# Patient Record
Sex: Female | Born: 1980 | Race: White | Hispanic: No | Marital: Married | Smoking: Never smoker
Health system: Southern US, Community
[De-identification: ages and names within clinical notes are randomized; demographics above are authoritative.]

## PROBLEM LIST (undated history)

## (undated) DIAGNOSIS — F419 Anxiety disorder, unspecified: Secondary | ICD-10-CM

## (undated) DIAGNOSIS — E063 Autoimmune thyroiditis: Secondary | ICD-10-CM

## (undated) DIAGNOSIS — F32A Depression, unspecified: Secondary | ICD-10-CM

## (undated) DIAGNOSIS — F329 Major depressive disorder, single episode, unspecified: Secondary | ICD-10-CM

## (undated) HISTORY — DX: Anxiety disorder, unspecified: F41.9

## (undated) HISTORY — PX: BREAST SURGERY: SHX581

## (undated) HISTORY — DX: Depression, unspecified: F32.A

## (undated) HISTORY — DX: Major depressive disorder, single episode, unspecified: F32.9

## (undated) HISTORY — PX: DILATION AND CURETTAGE OF UTERUS: SHX78

---

## 2008-05-19 DIAGNOSIS — F411 Generalized anxiety disorder: Secondary | ICD-10-CM | POA: Insufficient documentation

## 2008-11-07 DIAGNOSIS — L708 Other acne: Secondary | ICD-10-CM | POA: Insufficient documentation

## 2011-04-11 ENCOUNTER — Encounter: Payer: Self-pay | Admitting: Maternal & Fetal Medicine

## 2011-05-23 ENCOUNTER — Encounter: Payer: Self-pay | Admitting: Maternal & Fetal Medicine

## 2011-08-01 ENCOUNTER — Encounter: Payer: Self-pay | Admitting: Maternal & Fetal Medicine

## 2011-09-01 ENCOUNTER — Encounter: Payer: Self-pay | Admitting: Obstetrics and Gynecology

## 2011-09-29 ENCOUNTER — Encounter: Payer: Self-pay | Admitting: Obstetrics and Gynecology

## 2011-10-25 ENCOUNTER — Inpatient Hospital Stay: Payer: Self-pay | Admitting: Obstetrics and Gynecology

## 2011-10-25 LAB — CBC WITH DIFFERENTIAL/PLATELET
Basophil %: 0.2 %
Eosinophil #: 0 10*3/uL (ref 0.0–0.7)
Eosinophil %: 0.4 %
HCT: 38.7 % (ref 35.0–47.0)
HGB: 13.1 g/dL (ref 12.0–16.0)
Lymphocyte %: 16.1 %
MCH: 32.1 pg (ref 26.0–34.0)
MCHC: 33.8 g/dL (ref 32.0–36.0)
Neutrophil %: 75.6 %
Platelet: 138 10*3/uL — ABNORMAL LOW (ref 150–440)
RBC: 4.08 10*6/uL (ref 3.80–5.20)

## 2011-10-26 LAB — PLATELET COUNT: Platelet: 128 10*3/uL — ABNORMAL LOW (ref 150–440)

## 2012-04-04 HISTORY — PX: BREAST EXCISIONAL BIOPSY: SUR124

## 2012-04-04 HISTORY — PX: OTHER SURGICAL HISTORY: SHX169

## 2012-11-14 ENCOUNTER — Ambulatory Visit: Payer: Self-pay | Admitting: Internal Medicine

## 2013-02-20 ENCOUNTER — Ambulatory Visit: Payer: Self-pay | Admitting: Obstetrics and Gynecology

## 2013-03-26 ENCOUNTER — Ambulatory Visit: Payer: Self-pay | Admitting: Obstetrics and Gynecology

## 2013-03-26 ENCOUNTER — Other Ambulatory Visit: Payer: Self-pay | Admitting: Obstetrics and Gynecology

## 2013-03-26 DIAGNOSIS — R922 Inconclusive mammogram: Secondary | ICD-10-CM

## 2013-04-10 ENCOUNTER — Ambulatory Visit
Admission: RE | Admit: 2013-04-10 | Discharge: 2013-04-10 | Disposition: A | Discharge status: Home / Self Care | Payer: BC Managed Care – PPO | Source: Ambulatory Visit | Attending: Obstetrics and Gynecology | Admitting: Obstetrics and Gynecology

## 2013-04-10 DIAGNOSIS — R922 Inconclusive mammogram: Secondary | ICD-10-CM

## 2013-04-10 MED ORDER — GADOBENATE DIMEGLUMINE 529 MG/ML IV SOLN
11.0000 mL | Freq: Once | INTRAVENOUS | Status: AC | PRN
  Administered 2013-04-10: 11 mL via INTRAVENOUS

## 2013-04-11 ENCOUNTER — Other Ambulatory Visit: Payer: Self-pay

## 2013-04-11 ENCOUNTER — Encounter: Payer: Self-pay | Admitting: Specialist

## 2013-05-05 ENCOUNTER — Encounter: Payer: Self-pay | Admitting: Specialist

## 2013-06-02 ENCOUNTER — Encounter: Payer: Self-pay | Admitting: Specialist

## 2013-07-03 ENCOUNTER — Encounter: Payer: Self-pay | Admitting: Specialist

## 2013-11-13 DIAGNOSIS — Z8639 Personal history of other endocrine, nutritional and metabolic disease: Secondary | ICD-10-CM | POA: Insufficient documentation

## 2014-02-08 ENCOUNTER — Emergency Department: Payer: Self-pay | Admitting: Emergency Medicine

## 2014-02-08 LAB — URINALYSIS, COMPLETE
BILIRUBIN, UR: NEGATIVE
Bacteria: NONE SEEN
Glucose,UR: NEGATIVE mg/dL (ref 0–75)
KETONE: NEGATIVE
Leukocyte Esterase: NEGATIVE
Nitrite: NEGATIVE
PROTEIN: NEGATIVE
Ph: 6 (ref 4.5–8.0)
Specific Gravity: 1.006 (ref 1.003–1.030)
WBC UR: 1 /HPF (ref 0–5)

## 2014-02-08 LAB — CBC
HCT: 40.2 % (ref 35.0–47.0)
HGB: 13.6 g/dL (ref 12.0–16.0)
MCH: 31.8 pg (ref 26.0–34.0)
MCHC: 33.7 g/dL (ref 32.0–36.0)
MCV: 94 fL (ref 80–100)
PLATELETS: 135 10*3/uL — AB (ref 150–440)
RBC: 4.26 10*6/uL (ref 3.80–5.20)
RDW: 12.4 % (ref 11.5–14.5)
WBC: 4.1 10*3/uL (ref 3.6–11.0)

## 2014-02-08 LAB — HCG, QUANTITATIVE, PREGNANCY: Beta Hcg, Quant.: 21802 m[IU]/mL — ABNORMAL HIGH

## 2014-02-10 ENCOUNTER — Ambulatory Visit: Payer: Self-pay | Admitting: Obstetrics and Gynecology

## 2014-02-10 LAB — HCG, QUANTITATIVE, PREGNANCY: Beta Hcg, Quant.: 26814 m[IU]/mL — ABNORMAL HIGH

## 2014-02-13 ENCOUNTER — Ambulatory Visit: Payer: Self-pay | Admitting: Obstetrics and Gynecology

## 2014-02-13 LAB — HCG, QUANTITATIVE, PREGNANCY: BETA HCG, QUANT.: 5172 m[IU]/mL — AB

## 2014-03-07 ENCOUNTER — Ambulatory Visit: Payer: Self-pay | Admitting: Obstetrics and Gynecology

## 2014-03-07 LAB — HCG, QUANTITATIVE, PREGNANCY: BETA HCG, QUANT.: 458 m[IU]/mL — AB

## 2014-03-20 ENCOUNTER — Ambulatory Visit: Payer: Self-pay | Admitting: Obstetrics and Gynecology

## 2014-03-20 LAB — BASIC METABOLIC PANEL
Anion Gap: 8 (ref 7–16)
BUN: 10 mg/dL (ref 7–18)
CALCIUM: 8.4 mg/dL — AB (ref 8.5–10.1)
Chloride: 104 mmol/L (ref 98–107)
Co2: 28 mmol/L (ref 21–32)
Creatinine: 0.59 mg/dL — ABNORMAL LOW (ref 0.60–1.30)
EGFR (African American): >60
EGFR (Non-African Amer.): >60
GLUCOSE: 108 mg/dL — AB (ref 65–99)
Osmolality: 279 (ref 275–301)
Potassium: 4 mmol/L (ref 3.5–5.1)
Sodium: 140 mmol/L (ref 136–145)

## 2014-03-20 LAB — CBC WITH DIFFERENTIAL/PLATELET
BASOS ABS: 0 10*3/uL (ref 0.0–0.1)
Basophil %: 0.4 %
EOS ABS: 0 10*3/uL (ref 0.0–0.7)
Eosinophil %: 0.6 %
HCT: 41.6 % (ref 35.0–47.0)
HGB: 14.1 g/dL (ref 12.0–16.0)
Lymphocyte #: 1.2 10*3/uL (ref 1.0–3.6)
Lymphocyte %: 22.5 %
MCH: 31.8 pg (ref 26.0–34.0)
MCHC: 33.8 g/dL (ref 32.0–36.0)
MCV: 94 fL (ref 80–100)
Monocyte #: 0.3 x10 3/mm (ref 0.2–0.9)
Monocyte %: 6.1 %
NEUTROS ABS: 3.9 10*3/uL (ref 1.4–6.5)
Neutrophil %: 70.4 %
PLATELETS: 173 10*3/uL (ref 150–440)
RBC: 4.42 10*6/uL (ref 3.80–5.20)
RDW: 13.4 % (ref 11.5–14.5)
WBC: 5.5 10*3/uL (ref 3.6–11.0)

## 2014-03-24 ENCOUNTER — Ambulatory Visit: Payer: Self-pay | Admitting: Obstetrics and Gynecology

## 2014-07-28 LAB — SURGICAL PATHOLOGY

## 2014-07-30 NOTE — Op Note (Signed)
PATIENT NAME:  LANIER, FELTY MR#:  [id number] DATE OF BIRTH:  [birth year]  DATE OF PROCEDURE:  03/24/2014  PREOPERATIVE DIAGNOSIS: Incomplete spontaneous abortion.   POSTOPERATIVE DIAGNOSIS: Incomplete spontaneous abortion.  PROCEDURE: Suction dilatation and curettage.   SURGEON: Angelina Pih, MD  ESTIMATED BLOOD LOSS: 50 mL   IV FLUIDS: 600 mL   Urine Output: 250 mL   SPECIMENS: Endometrial curettings, and products of conception.   ANESTHESIA: General.   FINDINGS: Retroverted uterus, 7 weeks' size. Dark blood from cervical os.   INTRAOPERATIVE MEDICATIONS: Methergine 0.2 mg x 1.  COMPLICATIONS: None.   INDICATION FOR PROCEDURE: Ms. Roselinda Bahena is a 34 year old, gravida 3, para 2, 0-1-2, who is status post spontaneous abortion at approximately 74 weeks. She passed her tissue spontaneously; however, despite Methergine during that time p.o. at home, she continues to have vaginal bleeding. Her beta HCGs have been dropping appropriately. It has now been 2 months since her miscarriage, and she continues to bleed as though she is having a period. She has no systemic symptoms, and the incision was made to take her to the OR for a suction D and C. The risks and benefits were discussed with the patient and her family, and she did consent to this procedure. She received 100 mg of doxycycline p.o. prior to the procedure, and will receive 200 mg of doxycycline postoperative.  PROCEDURE IN DETAIL: Ms. Tamsen Reist presented to the Operating Room, where she was identified by name and birth date. General anesthesia was induced without difficulty in a supine position. She was then prepped and draped in the usual sterile fashion in a dorsal lithotomy position. A formal timeout procedure was performed with all team members present and in agreement.   A weighted speculum was placed in the vagina, and a single-tooth tenaculum was placed on the anterior lip of the cervix to visualize the cervical  os. The cervical os was serially dilated very gently, up to a 20 Hanks. A 7 flexible suction curette was placed in the cervix to the uterine fundus, which measured about 8 cm. Careful suction curettage was undertaken for a minimal amount of tissue.   Sharp curettage was then undertaken to assure a gritty texture in all 4 quadrants of the uterus. These endometrial curettings were passed off as pathology specimen for evaluation of chronic endometritis.   A final suction pass revealed minimal tissue and bleeding. The anterior tenaculum was removed from the lip of the cervix. Pressure was held using a sponge stick clamp for excellent hemostasis on the right, and a silver nitrate stick was used to assure hemostasis on the left tenaculum site. All instruments were then removed from the vagina,   The patient was given Toradol prior to leaving the OR in stable condition. She is now in stable condition in the PACU.    ____________________________ Angelina Pih, MD beb:MT D: 03/24/2014 12:17:42 ET T: 03/24/2014 14:33:54 ET JOB#: [id number]  cc: Angelina Pih, MD, <Dictator> Angelina Pih MD ELECTRONICALLY SIGNED 04/15/2014 9:26

## 2014-08-12 NOTE — H&P (Signed)
L&D Evaluation:  History:   HPI 34 year old Married white female, G2P1001 at 39.[redacted] weeks gestation presents for IOL. EDD 10/24/2011.    Patient's Medical History Thyroid Disease    Patient's Surgical History none    Medications Pre Natal Vitamins    Allergies NKDA    Social History none    Family History Non-Contributory   ROS:   ROS All systems were reviewed.  HEENT, CNS, GI, GU, Respiratory, CV, Renal and Musculoskeletal systems were found to be normal.   Exam:   Vital Signs stable    Urine Protein negative dipstick    General no apparent distress    Mental Status clear    Chest clear    Heart normal sinus rhythm    Abdomen gravid, non-tender    Estimated Fetal Weight Average for gestational age, 7#3    Back no CVAT    Edema no edema    Pelvic no external lesions, 2/70/-3/VTX/BOWI    Mebranes Intact    FHT normal rate with no decels    Ucx regular    Other B+/Atb-/NR/RI/VI/HB-/HIV-/GBS-/MSAFP normal/Glucola normal   Impression:   Impression TIUP; Hypothyroidism   Plan:   Plan Cytotec/Pitocin IOL; Epidural prn   Electronic Signatures: DeFrancesco, Alanda Slim (MD)  (Signed 24-Jul-13 08:30)  Authored: L&D Evaluation   Last Updated: 24-Jul-13 08:30 by DeFrancesco, Alanda Slim (MD)

## 2014-11-18 ENCOUNTER — Other Ambulatory Visit: Payer: Self-pay | Admitting: Internal Medicine

## 2014-11-18 DIAGNOSIS — M5116 Intervertebral disc disorders with radiculopathy, lumbar region: Secondary | ICD-10-CM

## 2014-11-24 ENCOUNTER — Ambulatory Visit
Admission: RE | Admit: 2014-11-24 | Discharge: 2014-11-24 | Disposition: A | Discharge status: Home / Self Care | Payer: No Typology Code available for payment source | Source: Ambulatory Visit | Attending: Internal Medicine | Admitting: Internal Medicine

## 2014-11-24 DIAGNOSIS — M5116 Intervertebral disc disorders with radiculopathy, lumbar region: Secondary | ICD-10-CM | POA: Diagnosis present

## 2014-11-24 DIAGNOSIS — M5137 Other intervertebral disc degeneration, lumbosacral region: Secondary | ICD-10-CM | POA: Diagnosis not present

## 2014-11-24 DIAGNOSIS — M4806 Spinal stenosis, lumbar region: Secondary | ICD-10-CM | POA: Diagnosis not present

## 2014-11-24 DIAGNOSIS — Q057 Lumbar spina bifida without hydrocephalus: Secondary | ICD-10-CM | POA: Diagnosis not present

## 2015-03-16 ENCOUNTER — Encounter: Payer: Self-pay | Admitting: Physical Therapy

## 2015-03-16 ENCOUNTER — Ambulatory Visit
Payer: No Typology Code available for payment source | Attending: Obstetrics and Gynecology | Admitting: Physical Therapy

## 2015-03-16 DIAGNOSIS — R198 Other specified symptoms and signs involving the digestive system and abdomen: Secondary | ICD-10-CM | POA: Diagnosis present

## 2015-03-16 DIAGNOSIS — R269 Unspecified abnormalities of gait and mobility: Secondary | ICD-10-CM | POA: Insufficient documentation

## 2015-03-16 DIAGNOSIS — M25251 Flail joint, right hip: Secondary | ICD-10-CM

## 2015-03-16 DIAGNOSIS — M24251 Disorder of ligament, right hip: Secondary | ICD-10-CM | POA: Diagnosis present

## 2015-03-16 DIAGNOSIS — M791 Myalgia: Secondary | ICD-10-CM | POA: Insufficient documentation

## 2015-03-16 DIAGNOSIS — M609 Myositis, unspecified: Secondary | ICD-10-CM | POA: Diagnosis present

## 2015-03-16 DIAGNOSIS — IMO0001 Reserved for inherently not codable concepts without codable children: Secondary | ICD-10-CM

## 2015-03-16 NOTE — Therapy (Signed)
[facility] [address] [place], [place], [address] Phone: [phone]   Fax:  [phone]  Physical Therapy Evaluation  Patient Details  Name: Bianca Brennan MRN: [id number] Date of Birth: 28-Nov-1980 Referring Provider: Leafy Ro  Encounter Date: 03/16/2015      PT End of Session - 03/16/15 1809    Visit Number 1   Number of Visits 12   Date for PT Re-Evaluation 06/08/15   PT Start Time [id number]   PT Stop Time 1730   PT Time Calculation (min) 85 min   Activity Tolerance Patient tolerated treatment well;No increased pain   Behavior During Therapy [facility] for tasks assessed/performed      Past Medical History  Diagnosis Date  . 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 Anxiety     related to CLBP    Past Surgical History  Procedure Laterality Date  . 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  Visit Diagnosis:  Gait abnormality - Plan: PT plan of care cert/re-cert, CANCELED: PT plan of care cert/re-cert  Hip laxity, right - Plan: PT plan of care cert/re-cert, CANCELED: PT plan of care cert/re-cert  Abdominal weakness - Plan: PT plan of care cert/re-cert, CANCELED: PT plan of care cert/re-cert  Myalgia and myositis - Plan: PT plan of care cert/re-cert, CANCELED: PT plan of care cert/re-cert      Subjective Assessment - 03/16/15 1810    Subjective 35 years ago, pt hurt her back at a gym class [facility]) after performing sit-ups and got up from the floor in an incorrect way. Pt got an MRI from an orthopedic practice and learned she had mild-mod disc bulge at L4-5. Pt experienced L low back mm spasm during this time. Pt did not seek any medical Tx as it felt only "annoying with a feeling of tightness " to her. After the birth of her second dtr in 2014, pt sought chiropractic Tx for L plantar fascitis and ended up receiving an adjustment on her pelvis. After the Tx, pt noticed  numbness in her R foot which persisted as  constant radiating pain down inner thigh to the top of the foot R > L.   Movement  (walking, standing) increased Sx while  relief came with sitting. 6 months after this incident Nov 2014, pt experienced severe flare-up of these Sx (mm spasm and radiating pain R LE) after overstretching in a [employer] class. Pt refers to having "sprained her hip" and has received 3-4 steriod injections on her R side. Pt's last injection was 6 months ago. Across the past month, pt rates her pain at 6/10 for numbness and peaks to  7/10 across 5-6 days/ week.  Pt is unclear what "flares" her Sx. Pt had a good month in June and is unsure what flared it up after that month. Upon further questioning, pt responded that she started a mat Pilates class and was doing some abd workout. Currently, she reports she has  increased pain with sitting on the floor with kids, load/ unloading laundry. Pt feels frustrated with daily activities with household chores, floor to rise, getting items out of cabinets.                 [facility] PT Assessment - 03/16/15 1743    Assessment   Medical Diagnosis Pelvic pain   Referring Provider [facility]   Precautions  Precautions None   Restrictions   Weight Bearing Restrictions No   Balance Screen   Has the patient fallen in the past 6 months No   Prior Function   Level of Independence Independent   Observation/Other Assessments   Observations slumped sitting    Other Surveys  --  ZUNG 54%, PDI 43%, PGQ 47%    Sensation   Light Touch --  no significant findings   Coordination   Gross Motor Movements are Fluid and Coordinated --  initially chest breathing,demo diaphram/pelvic floor breathi   AROM   Overall AROM  --  limited segmental movement at lumbar in spinal flexion   Overall AROM Comments Hip IR PROM lwith lumbar sidebend at end range ~15 deg     Strength   Overall Strength --  R hip ext in prone 4/5, L 5/5/    Overall Strength Comments  delayed multifidis on L (hamstring dominant) w/ hip ext    Palpation   Spinal mobility hypomobility at T 10-12    SI assessment  increased movement at R PSIS in stork test and hip ext/ knee flexed   standing. (Increased stability post-Tx)   Palpation comment R coccygeus w/ increased mm tenderness and tensions (decreased post-Tx), coccyx deviated R   no pelvic asymmetries in prone/supine   Ambulation/Gait   Gait Pattern Trunk rotated posteriorly on right  supination in stance phase                 Pelvic Floor Special Questions - 03/16/15 1752    Diastasis Recti 3 fingers below umbilicus          [facility] Adult PT Treatment/Exercise - 03/16/15 1743    Self-Care   Other Self-Care Comments  POC, goals, anatomy and physiology   Neuro Re-ed    Neuro Re-ed Details  decrease bearing down on pelvic floor and abdomen, proper deep core breathing coordination, visual cues for deep core activation     Manual Therapy   Joint Mobilization lateral glide along edge of coccyx towards midline from R    Soft tissue mobilization R coccygeus                 PT Education - 03/16/15 1808    Education provided Yes   Education Details HEP, POC, goals, anatomy and physiology    Person(s) Educated Patient   Methods Explanation;Demonstration;Tactile cues;Verbal cues;Handout   Comprehension Verbalized understanding;Returned demonstration             PT Long Term Goals - 03/16/15 1818    PT LONG TERM GOAL #1   Title Pt will decrease her score on ZUNG Anxiety from 54% to  < 44% in order to improve QOL related to her functional mobility.    Time 12   Period Weeks   Status New   PT LONG TERM GOAL #2   Title Pt will decrease her score on [place] from 43% to < 33% in order to increase participation in family and community activities.    Time 12   Period Weeks   Status New   PT LONG TERM GOAL #3   Title Pt will decrease her score on  PGQ 47%  to < 37% in order to return ADLs.    Time  12   Period Weeks   Status New   PT LONG TERM GOAL #4   Title Pt will demo no movement of at R PSIS/ilia with L SLS and R hip ext in order to improved  pelvic stability to progress to loaded exercise.    Time 12   Period Weeks   Status New   PT LONG TERM GOAL #5   Title Pt will demo no lumbopelvic instability with dynamic stability level 1-4 and  ASLR 5 reps with deep core activation without cuing in order to minimize relapse of Sx.    Time 12   Period Weeks   Status New   Additional Long Term Goals   Additional Long Term Goals Yes   PT LONG TERM GOAL #6   Title Pt will report pain that is at a 7/10 from 5-6 days/ week to < 3 days week in order to show improved self-management of pain.     Time 12   Period Weeks   Status New   PT LONG TERM GOAL #7   Title Pt will be compliant with reading "Why I Still Hurt?" and practicing relaxation technique (body scan) for 5 days /week in order to show improved self-management of pain.    Time 12   Period Weeks   Status New   PT LONG TERM GOAL #8   Title Pt will be able to perform various positions in sitting on the floor, getting up from the floor, and reachini into low cabinets with proper body mechanics at < 2/10 pain  in order to perform ADLs and play with dtrs.    Time 12   Period Weeks   Status New               Plan - 03/16/15 1809    Clinical Impression Statement Pt is a 34 yo female whose S & Sx consist  of radiating RLE >L  LE numbness,  diastasis recti, poor coordination and  strength of deep core mm, weak hip mm, increased R pelvic floor mm  tension, deviated coccyx, gait deviations, and pelvic girdle instability. These deficits limit her ability to perform household chores, lift her children, and particiapte in fitness activities.      Pt will benefit from skilled therapeutic intervention in order to improve on the following deficits Decreased coordination;Decreased safety awareness;Hypermobility;Postural  dysfunction;Improper body mechanics;Impaired perceived functional ability;Decreased range of motion;Decreased balance;Decreased mobility;Difficulty walking;Increased muscle spasms;Decreased strength;Pain;Hypomobility;Decreased endurance;Abnormal gait   Rehab Potential Good   PT Frequency 2x / week   PT Duration 12 weeks   PT Treatment/Interventions ADLs/Self Care Home Management;Neuromuscular re-education;Moist Heat;Traction;Balance training;Therapeutic exercise;Iontophoresis 4mg /ml Dexamethasone;Electrical Stimulation;Cryotherapy;Stair training;Functional mobility training;Therapeutic activities;Manual techniques;Patient/family education;Passive range of motion;Taping;Energy conservation;Dry needling;Gait training; education on safe and proper ways to strengthen her core.    PT Next Visit Plan Address DRA and hypomobility at T spine, gait training, body mechanics with lifting   Consulted and Agree with Plan of Care Patient         Problem List There are no active problems to display for this patient.   Jerl Mina ,PT, DPT, E-RYT  03/16/2015, 6:46 PM  [place] MAIN [facility] SERVICES [address] [place], [place], [address] Phone: [phone]   Fax:  [phone]  Name: Brittannie Szafranski MRN: [id number] Date of Birth: 12-18-1980

## 2015-03-16 NOTE — Patient Instructions (Signed)
You are now ready to begin training the deep core muscles system: diaphragm, transverse abdominis, pelvic floor . These muscles must work together as a team.         The key to these exercises to train the brain to coordinate the timing of these muscles and to have them turn on for long periods of time to hold you upright against gravity (especially important if you are on your feet all day).These muscles are postural muscles and play a role stabilizing your spine and bodyweight. By doing these repetitions slowly and correctly instead of doing crunches, you will achieve a flatter belly without a lower pooch. You are also placing your spine in a more neutral position and breathing properly which in turn, decreases your risk for problems related to your pelvic floor, abdominal, and low back such as pelvic organ prolapse, hernias, diastasis recti (separation of superficial muscles), disk herniations, spinal fractures. These exercises set a solid foundation for you to later progress to resistance/ strength training with therabands and weights and return to other typical fitness exercises with a stronger deeper core.                                                   Preserve the function of your pelvic floor, abdomen, and back.              Avoid decreased straining of abdominal/pelvic floor muscles with less              slouching,  holding your breath with lifting/bowel movements)                                                     FUNCTIONAL POSTURES          

## 2015-03-20 ENCOUNTER — Ambulatory Visit: Payer: No Typology Code available for payment source | Admitting: Physical Therapy

## 2015-03-20 DIAGNOSIS — R198 Other specified symptoms and signs involving the digestive system and abdomen: Secondary | ICD-10-CM

## 2015-03-20 DIAGNOSIS — M25251 Flail joint, right hip: Secondary | ICD-10-CM

## 2015-03-20 DIAGNOSIS — R269 Unspecified abnormalities of gait and mobility: Secondary | ICD-10-CM | POA: Diagnosis not present

## 2015-03-20 DIAGNOSIS — IMO0001 Reserved for inherently not codable concepts without codable children: Secondary | ICD-10-CM

## 2015-03-20 NOTE — Therapy (Addendum)
[place] MAIN [facility] SERVICES [address] [place], [place], [address] Phone: [phone]   Fax:  [phone]  Physical Therapy Treatment  Patient Details  Name: [facility]ca [facility] MRN: [id number] Date of Birth: June 24, 1980 Referring Provider: Leafy Ro  Encounter Date: 03/20/2015    Past Medical History  Diagnosis Date  . 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 Anxiety     related to CLBP    Past Surgical History  Procedure Laterality Date  . 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  Visit Diagnosis:  Gait abnormality  Hip laxity, right  Abdominal weakness  Myalgia and myositis      Subjective Assessment - 03/23/15 2307    Subjective Pt reports her sciatica bothered her a bit the night after last session but it dissipated the following morning. Pt hdid not have sciatic Sx on an active day and today, after doing the elliptical with practice of breathing and engaging in her deep core mm. These are improvements because she usually felt the sciatic on active days and after working out on the elliptical.    Pertinent History Currently, workout includes : 30 sec 5 reps planks, elliptical 5-6 days/ hr, 3# UE standing. Lifting 51yr 30# out of bed, in/out car,    Patient Stated Goals minimize flare-ups (decreasing from 5-6 days/week to < 3 days/ week at 1-2/10)   and strengthen core to be ready to have 3rd child             [facility] PT Assessment - 03/23/15 2307    Coordination   Gross Motor Movements are Fluid and Coordinated --  limited diaphragmatic excursion, post-Tx: increased   Palpation   SI assessment  pre-Tx: STORK test: R PSISsignificantly unstable in L SLS, postTx: R PSIS moderately unstable in L SLS   Palpation comment coccyx medially aligned                  Pelvic Floor Special Questions - 03/23/15 2321    Pelvic Floor Internal Exam pt consented verbally without  contraindications   Strength fair squeeze, definite lift   Biofeedback required coordination cuing to coordinate with diaphragmatic breathing            [facility] Adult PT Treatment/Exercise - 03/23/15 2315    Neuro Re-ed    Neuro Re-ed Details  less abdominal expansion, more diaphragmatic excursion, pelvic floor coordination   dynamic staboilization 1-2 (10 reps)    Exercises   Exercises --  prone heel press 5 sec holds, open book  (10 reps)    Manual Therapy   Joint Mobilization grade III along T5-10 SP    Soft tissue mobilization midback mm releases  MWM open book                 PT Education - 03/23/15 2317    Education provided Yes   Education Details HEP   Person(s) Educated Patient   Methods Explanation;Demonstration;Tactile cues;Verbal cues;Handout   Comprehension Verbalized understanding;Returned demonstration             PT Long Term Goals - 03/16/15 1818    PT LONG TERM GOAL #1   Title Pt will decrease her score on ZUNG Anxiety from 54% to  < 44% in order to improve QOL related to her functional mobility.    Time 12   Period Weeks   Status  New   PT LONG TERM GOAL #2   Title Pt will decrease her score on [place] from 43% to < 33% in order to increase participation in family and community activities.    Time 12   Period Weeks   Status New   PT LONG TERM GOAL #3   Title Pt will decrease her score on  PGQ 47%  to < 37% in order to return ADLs.    Time 12   Period Weeks   Status New   PT LONG TERM GOAL #4   Title Pt will demo no movement of at R PSIS/ilia with L SLS and R hip ext in order to improved pelvic stability to progress to loaded exercise.    Time 12   Period Weeks   Status New   PT LONG TERM GOAL #5   Title Pt will demo no lumbopelvic instability with dynamic stability level 1-4 and  ASLR 5 reps with deep core activation without cuing in order to minimize relapse of Sx.    Time 12   Period Weeks   Status New   Additional Long Term Goals    Additional Long Term Goals Yes   PT LONG TERM GOAL #6   Title Pt will report pain that is at a 7/10 from 5-6 days/ week to < 3 days week in order to show improved self-management of pain.     Time 12   Period Weeks   Status New   PT LONG TERM GOAL #7   Title Pt will be compliant with reading "Why I Still Hurt?" and practicing relaxation technique (body scan) for 5 days /week in order to show improved self-management of pain.    Time 12   Period Weeks   Status New   PT LONG TERM GOAL #8   Title Pt will be able to perform various positions in sitting on the floor, getting up from the floor, and reachini into low cabinets with proper body mechanics at < 2/10 pain  in order to perform ADLs and play with dtrs.    Time 12   Period Weeks   Status New               Plan - 03/23/15 2318    Clinical Impression Statement Pt responded well to manual Tx at last session with report of no pain after working out on the elliptical. Pt continues to respond well to manual Tx with improved R PSIS stability and progressed to deep core exercises with cuing to increase diaphragmatic excursions instead of abdominal expansion. Pt showed improved pelvic floor coordination with internal assessment. Pt required motor control training to decrease previous faulty movement patterns from [place] and pilates classes. Anticipate pt will continue to progress towards her goals.    Pt will benefit from skilled therapeutic intervention in order to improve on the following deficits Decreased coordination;Decreased safety awareness;Hypermobility;Postural dysfunction;Improper body mechanics;Impaired perceived functional ability;Decreased range of motion;Decreased balance;Decreased mobility;Difficulty walking;Increased muscle spasms;Decreased strength;Pain;Hypomobility;Decreased endurance;Abnormal gait   Rehab Potential Good   PT Frequency 2x / week   PT Duration 12 weeks   PT Treatment/Interventions ADLs/Self Care Home  Management;Neuromuscular re-education;Moist Heat;Traction;Balance training;Therapeutic exercise;Iontophoresis 4mg /ml Dexamethasone;Electrical Stimulation;Cryotherapy;Stair training;Functional mobility training;Therapeutic activities;Manual techniques;Patient/family education;Passive range of motion;Taping;Energy conservation;Dry needling;Gait training   PT Next Visit Plan Address DRA and hypomobility at T spine, gait training, body mechanics with lifting   Consulted and Agree with Plan of Care Patient        Problem List There are  no active problems to display for this patient.   Jerl Mina ,PT, DPT, E-RYT  03/23/2015, 11:23 PM  [place] MAIN [facility] SERVICES [address] [place], [place], [address] Phone: [phone]   Fax:  [phone]  Name: Bianca Brennan MRN: [id number] Date of Birth: 1980/07/16

## 2015-03-24 ENCOUNTER — Encounter: Payer: No Typology Code available for payment source | Admitting: Physical Therapy

## 2015-03-25 ENCOUNTER — Ambulatory Visit: Payer: No Typology Code available for payment source | Admitting: Physical Therapy

## 2015-03-25 DIAGNOSIS — IMO0001 Reserved for inherently not codable concepts without codable children: Secondary | ICD-10-CM

## 2015-03-25 DIAGNOSIS — M25251 Flail joint, right hip: Secondary | ICD-10-CM

## 2015-03-25 DIAGNOSIS — R269 Unspecified abnormalities of gait and mobility: Secondary | ICD-10-CM

## 2015-03-25 DIAGNOSIS — R198 Other specified symptoms and signs involving the digestive system and abdomen: Secondary | ICD-10-CM

## 2015-03-26 ENCOUNTER — Ambulatory Visit: Payer: No Typology Code available for payment source | Admitting: Physical Therapy

## 2015-03-26 NOTE — Therapy (Signed)
[place] MAIN [facility] [address] [place], [place], [address] Phone: [phone]   Fax:  [phone]  Physical Therapy Treatment  Patient Details  Name: Bianca Brennan MRN: [id number] Date of Birth: 02-17-1981 Referring Provider: Leafy Ro  Encounter Date: 03/25/2015      PT End of Session - 03/26/15 1202    Visit Number 3   Number of Visits 12   Date for PT Re-Evaluation 06/08/15   PT Start Time 1000   PT Stop Time 1100   PT Time Calculation (min) 60 min   Activity Tolerance Patient tolerated treatment well;No increased pain   Behavior During Therapy [facility] for tasks assessed/performed      Past Medical History  Diagnosis Date  . 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 Anxiety     related to CLBP    Past Surgical History  Procedure Laterality Date  . 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  Visit Diagnosis:  Hip laxity, right  Gait abnormality  Abdominal weakness  Myalgia and myositis      Subjective Assessment - 03/25/15 1014    Subjective Pt reported feeling "good" after last session for three days without radiating pain after performing normal ADLs including the elliptical. However,  R radiating pain did occur with carrying a heavy grocery cart, loading laundry, and rising from the floor repeatedly with picking up toys and wrapping paper.    Pertinent History Currently, workout includes : 30 sec 5 reps planks, elliptical 5-6 days/ hr, 3# UE standing. Lifting 74yr30# out of bed, in/out car,    Patient Stated Goals minimize flare-ups (decreasing from 5-6 days/week to < 3 days/ week at 1-2/10)   and strengthen core to be ready to have 3rd child             [facility] - 03/[facility] 1156    Coordination   Gross Motor Movements are Fluid and Coordinated --  requried cuing for Dyn stab. level 3 initally w/ L leg lift    Lunges   Comments poor lower kinetic  chain alignment with pelvic girdle instability on self-preferred technique, reported readiating pain    proper technique w/ no radiating pain    Floor to Stand   Comments poor lower kinetic chain alignment with pelvic girdle instability on self-preferred technique, reported readiating pain    proper technique w/ no radiating pain                   Pelvic Floor Special Questions - 03/26/15 1156    Pelvic Floor Internal Exam pt consented verbally without contraindications   Strength fair squeeze, definite lift   Strength # of reps 4   Strength # of seconds 10   Biofeedback required cuing to pause between breaths to not rush            [facility]/Exercise - 03/26/15 1159    Therapeutic Activites    ADL's double kneeling with simulated task of unload laundry to minimize pelvic girdle instability 5 reps    Lifting golfer's lift and low lunge , floor to rise 3 reps each with cuing on alignment to minimize BOS and pelvic girdle instability   lifting to mimic carrying grocery basket x 3   Neuro Re-ed    Neuro Re-ed Details  see pt instructions    body mechanics,pelvic  floor endurance training, dyn stab 3                PT Education - 03/26/15 1307    Education provided Yes   Education Details HEP   Person(s) Educated Patient   Methods Explanation;Demonstration;Tactile cues;Verbal cues  emailed instructions   Comprehension Verbalized understanding;Returned demonstration             PT Long Term Goals - 03/26/15 1307    PT LONG TERM GOAL #1   Title Pt will decrease her score on ZUNG Anxiety from 54% to  < 44% in order to improve QOL related to her functional mobility.    Time 12   Period Weeks   Status On-going   PT LONG TERM GOAL #2   Title Pt will decrease her score on [place] from 43% to < 33% in order to increase participation in family and community activities.    Time 12   Period Weeks   Status On-going   PT LONG TERM GOAL #3   Title Pt  will decrease her score on  PGQ 47%  to < 37% in order to return ADLs.    Time 12   Period Weeks   Status On-going   PT LONG TERM GOAL #4   Title Pt will demo normal movement of  R PSIS on ilium with L SLS and R hip ext in order to improved pelvic stability to progress to loaded exercise.    Time 12   Period Weeks   Status Achieved   PT LONG TERM GOAL #5   Title Pt will demo no lumbopelvic instability with dynamic stability level 1-4 and  ASLR 5 reps with deep core activation without cuing in order to minimize relapse of Sx.    Time 12   Period Weeks   Status On-going   PT LONG TERM GOAL #6   Title Pt will report pain that is at a 7/10 from 5-6 days/ week to < 3 days week in order to show improved self-management of pain.     Time 12   Period Weeks   Status On-going   PT LONG TERM GOAL #7   Title Pt will be compliant with reading "Why I Still Hurt?" and practicing relaxation technique (body scan) for 5 days /week in order to show improved self-management of pain.    Time 12   Period Weeks   Status On-going   PT LONG TERM GOAL #8   Title Pt will be able to perform various positions in sitting on the floor, getting up from the floor, and reachini into low cabinets with proper body mechanics at < 2/10 pain  in order to perform ADLs and play with dtrs.    Time 12   Period Weeks   Status Partially Met               Plan - 03/26/15 1202    Clinical Impression Statement Pt continues to report increased ability to resume use of elliptical without radicular Sx. Pt's radicular Sx were reported to have occurred with floor to rise t/f, lifting, and loading laundry. Pt originally demo'd poor lower kinetic chain alignment with poor pelvic girdle stability when using her self-selected techinque with these activities. After training, pt demo'd proper body mechanics and reported no readicular Sx. Pt continues to show good carry over with improved SIJ alignment, symmetry, and stability. Pt  progressed with deep core strengthening and was initiated with pelvic floor endurance training as she  demo'd low pelvic floor endurance. Anticipate pt will continue to benefit from skilled PT to progress towards her goals.    Pt will benefit from skilled therapeutic intervention in order to improve on the following deficits Decreased coordination;Decreased safety awareness;Hypermobility;Postural dysfunction;Improper body mechanics;Impaired perceived functional ability;Decreased range of motion;Decreased balance;Decreased mobility;Difficulty walking;Increased muscle spasms;Decreased strength;Pain;Hypomobility;Decreased endurance;Abnormal gait   Rehab Potential Good   PT Frequency 2x / week   PT Duration 12 weeks   PT Treatment/Interventions ADLs/Self Care Home Management;Neuromuscular re-education;Moist Heat;Traction;Balance training;Therapeutic exercise;Iontophoresis 86m/ml Dexamethasone;Electrical Stimulation;Cryotherapy;Stair training;Functional mobility training;Therapeutic activities;Manual techniques;Patient/family education;Passive range of motion;Taping;Energy conservation;Dry needling;Gait training   PT Next Visit Plan Address DRA and hypomobility at T spine, gait training, body mechanics with lifting   Consulted and Agree with Plan of Care Patient        Problem List There are no active problems to display for this patient.   YJerl Mina,PT, DPT, E-RYT  03/26/2015, 1:15 PM  [place] [facility]SERV[facility] [address] [place] [address]Phone: [phone]  Fax:  [phone] Name: Bianca TinerMRN: [id number] of Birth: 905-06-1980

## 2015-03-26 NOTE — Patient Instructions (Addendum)
  PELVIC FLOOR / KEGEL EXERCISES   Pelvic floor/ Kegel exercises are used to strengthen the muscles in the base of your pelvis that are responsible for supporting your pelvic organs and preventing urine/feces leakage. Based on your therapist's recommendations, they can be performed while standing, sitting, or lying down. Imagine pelvic floor area as a diamond with pelvic landmarks: top =pubic bone, bottom tip=tailbone, sides=sitting bones (ischial tuberosities).    Make yourself aware of this muscle group by using these cues while coordinating your breath:  Inhale, feel pelvic floor diamond area lower like hammock towards your feet and ribcage/belly expanding. Pause. Let the exhale naturally and feel your belly sink, abdominal muscles hugging in around you and you may notice the pelvic diamond draws upward towards your head forming a umbrella shape. Give a squeeze during the exhalation like you are stopping the flow of urine. If you are squeezing the buttock muscles, try to give 50% less effort.   Common Errors:  Breath holding: If you are holding your breath, you may be bearing down against your bladder instead of pulling it up. If you belly bulges up while you are squeezing, you are holding your breath. Be sure to breathe gently in and out while exercising. Counting out loud may help you avoid holding your breath.  Accessory muscle use: You should not see or feel other muscle movement when performing pelvic floor exercises. When done properly, no one can tell that you are performing the exercises. Keep the buttocks, belly and inner thighs relaxed.  Overdoing it: Your muscles can fatigue and stop working for you if you over-exercise. You may actually leak more or feel soreness at the lower abdomen or rectum.  YOUR HOME EXERCISE PROGRAM  LONG HOLDS: Position: on back  Inhale and then exhale. Then squeeze the muscle and count aloud for 10 seconds. Rest with three long breaths. (Be sure to let  belly sink in with exhales and not push outward)  Perform 4 repetitions, 3 times/day               DECREASE DOWNWARD PRESSURE ON  YOUR PELVIC FLOOR, ABDOMINAL, LOW BACK MUSCLES       PRESERVE YOUR PELVIC HEALTH LONG-TERM   ** SQUEEZE pelvic floor BEFORE YOUR SNEEZE, COUGH, LAUGH   ** EXHALE BEFORE YOU RISE AGAINST GRAVITY (lifting, sit to stand, from squat to stand)   ** LOG ROLL OUT OF BED INSTEAD OF CRUNCH/SIT-UP     ___________________________________  Dynamic Stabilization Level 2 - knee out  (20 reps)  And Level 3 (20 reps) /day  ___________________________________  Body mechanics with floor to rise activities :  Loading laundry in double kneeling -toes tucked under, pelvic neutral  Picking up light objects "counterbalance lift" technique with hips squared to floor (not tilted, keep imaginary plate on your back leveled) , lunge with front knee above ankle and push body up with back foot toes tucked under  ____________________________________

## 2015-03-31 ENCOUNTER — Ambulatory Visit: Payer: No Typology Code available for payment source | Admitting: Physical Therapy

## 2015-04-01 ENCOUNTER — Ambulatory Visit: Payer: No Typology Code available for payment source | Admitting: Physical Therapy

## 2015-04-09 ENCOUNTER — Ambulatory Visit: Payer: Managed Care, Other (non HMO) | Attending: Obstetrics and Gynecology | Admitting: Physical Therapy

## 2015-04-09 DIAGNOSIS — M609 Myositis, unspecified: Secondary | ICD-10-CM | POA: Diagnosis present

## 2015-04-09 DIAGNOSIS — IMO0001 Reserved for inherently not codable concepts without codable children: Secondary | ICD-10-CM

## 2015-04-09 DIAGNOSIS — R269 Unspecified abnormalities of gait and mobility: Secondary | ICD-10-CM | POA: Insufficient documentation

## 2015-04-09 DIAGNOSIS — M24251 Disorder of ligament, right hip: Secondary | ICD-10-CM | POA: Insufficient documentation

## 2015-04-09 DIAGNOSIS — M791 Myalgia: Secondary | ICD-10-CM | POA: Diagnosis present

## 2015-04-09 DIAGNOSIS — R198 Other specified symptoms and signs involving the digestive system and abdomen: Secondary | ICD-10-CM | POA: Insufficient documentation

## 2015-04-09 DIAGNOSIS — M25251 Flail joint, right hip: Secondary | ICD-10-CM

## 2015-04-09 NOTE — Patient Instructions (Addendum)
Handout on upright diaphragmatic expansion     Multifidis twist: facing perpendicular to door,  ( yellow band on doorknob,  held in both with thumbs up ) rotate trunk 10-15 deg on exhale without moving pelvis and hips, knees   15 x  2 x day       Shoulder stabilizer, latissmus (progressing towards strengthening thoracolumbar fascia)  Double kneeling with toes tucked under, facing door, Lat pull downs       Continue with Deep Core Level 3:     30 x        Standing with arms behind back for expansion of diaphragm

## 2015-04-10 NOTE — Therapy (Signed)
[place] MAIN [facility] SERVICES [address] [place], [place], [address] Phone: [phone]   Fax:  [phone]  Physical Therapy Treatment  Patient Details  Name: Bianca Brennan MRN: [id number] Date of Birth: 1980-11-12 Referring Provider: Leafy Ro  Encounter Date: 04/09/2015    Past Medical History  Diagnosis Date  . 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 Anxiety     related to CLBP    Past Surgical History  Procedure Laterality Date  . 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  Visit Diagnosis:  Hip laxity, right  Gait abnormality  Abdominal weakness  Myalgia and myositis      Subjective Assessment - 04/09/15 1708    Subjective Pt reported she has overall "improved" across the past 2 weeks. The radiating pain has decreased in intensity by 70% in terms of dissipating quicker.  Golfer's lift has been  Facilities manager.  Pt no longer has issues getting up from chair, picking items from the ground. Currently the position that causes the radiating pain occurs when she is double kneeling to braid dtrs' hair.    Pertinent History Currently, workout includes : 30 sec 5 reps planks, elliptical 5-6 days/ hr, 3# UE standing. Lifting 52yr30# out of bed, in/out car,    Patient Stated Goals minimize flare-ups (decreasing from 5-6 days/week to < 3 days/ week at 1-2/10)   and strengthen core to be ready to have 3rd child             [facility]PT Assessment - 04/10/15 2042    Observation/Other Assessments   Observations upright sitting, cuing for feet under knees, arms behind back when standing    Coordination   Gross Motor Movements are Fluid and Coordinated --  limited posterior expansion of ribcage w/ inspiration   Palpation   SI assessment  PSIS aligned, proper movement in SLS without LOB                      [facility] Adult PT Treatment/Exercise - 04/10/15 2046    Self-Care   Other  Self-Care Comments  educated about prnciples of stability in dynamic stability postures like double kneeling, half kneeling.    Therapeutic Activites    Other Therapeutic Activities double kneeling to braid dtr's hair with neutral pelvis, toes tucks   Neuro Re-ed    Neuro Re-ed Details  diaphragmatic expansion posteriorly, tactile cues  standing posture, arms behind back   Exercises   Exercises --  see pt instructions                     PT Long Term Goals - 04/10/15 2048    PT LONG TERM GOAL #1   Title Pt will decrease her score on ZUNG Anxiety from 54% to  < 44% in order to improve QOL related to her functional mobility.    Time 12   Period Weeks   Status On-going   PT LONG TERM GOAL #2   Title Pt will decrease her score on [place] 43[place] to < 33% in order to increase participation in family and community activities.    Time 12   Period Weeks   Status On-going   PT LONG TERM GOAL #3   Title Pt will decrease her score on  PGQ 47%  to < 37% in order to return ADLs.  Time 12   Period Weeks   Status On-going   PT LONG TERM GOAL #4   Title Pt will demo normal movement of  R PSIS on ilium with L SLS and R hip ext in order to improved pelvic stability to progress to loaded exercise.    Time 12   Period Weeks   Status Achieved   PT LONG TERM GOAL #5   Title Pt will demo no lumbopelvic instability with dynamic stability level 1-4 and  ASLR 5 reps with deep core activation without cuing in order to minimize relapse of Sx.    Time 12   Period Weeks   Status On-going   PT LONG TERM GOAL #6   Title Pt will report pain that is at a 7/10 from 5-6 days/ week to < 3 days week in order to show improved self-management of pain.     Time 12   Period Weeks   Status Achieved   PT LONG TERM GOAL #7   Title Pt will be compliant with reading "Why I Still Hurt?" and practicing relaxation technique (body scan) for 5 days /week in order to show improved self-management of pain.     Time 12   Period Weeks   Status On-going   PT LONG TERM GOAL #8   Title Pt will be able to perform various positions in sitting on the floor, getting up from the floor, and reaching into low cabinets with proper body mechanics at < 2/10 pain  in order to perform ADLs and play with dtrs.    Time 12   Period Weeks   Status Partially Met   PT LONG TERM GOAL  #9   TITLE Pt will be able to perform a sequence of 5-10 yoga postures with proper alignment and deep core engagement in order to return to her hobby and fitness routine.    Time 12   Period Weeks   Status New               Plan - 04/10/15 2051    Clinical Impression Statement Pt is progressing well towards her goals. She has maintained symmetrical alignment of her PSIS across the past 2 visits and demonstrates improved pelvic girdle stability. Pt still requires motor control training with diaphragmatic breathing in upright posture but shows more neutral pelvis alignment. Progressed pt to resistive band exercise to strengthen mutlifidis. Continued to incorporate dynamic stbaility training into functional activities. Re-assess questionnaires at next session. Pt's frequency has decreased to 1x week due to pt's improvements.     Pt will benefit from skilled therapeutic intervention in order to improve on the following deficits Decreased coordination;Decreased safety awareness;Hypermobility;Postural dysfunction;Improper body mechanics;Impaired perceived functional ability;Decreased range of motion;Decreased balance;Decreased mobility;Difficulty walking;Increased muscle spasms;Decreased strength;Pain;Hypomobility;Decreased endurance;Abnormal gait   Rehab Potential Good   PT Frequency 1x / week   PT Duration 12 weeks   PT Treatment/Interventions ADLs/Self Care Home Management;Neuromuscular re-education;Moist Heat;Traction;Balance training;Therapeutic exercise;Iontophoresis 94m/ml Dexamethasone;Electrical Stimulation;Cryotherapy;Stair  training;Functional mobility training;Therapeutic activities;Manual techniques;Patient/family education;Passive range of motion;Taping;Energy conservation;Dry needling;Gait training   PT Next Visit Plan Address DRA and hypomobility at T spine, gait training, body mechanics with lifting   Consulted and Agree with Plan of Care Patient        Problem List There are no active problems to display for this patient.   Bianca Brennan,PT, DPT, E-RYT  04/10/2015, 8:54 PM  [place]MAIN RPav[facility] Surgery [facility] LLCSERVICES [address] [place] [address]Phone: [phone]  Fax:  [phone]  Name: Bianca Brennan MRN: [id number] Date of Birth: [birth year]

## 2015-04-16 ENCOUNTER — Ambulatory Visit: Payer: Managed Care, Other (non HMO) | Admitting: Physical Therapy

## 2015-04-16 DIAGNOSIS — R198 Other specified symptoms and signs involving the digestive system and abdomen: Secondary | ICD-10-CM

## 2015-04-16 DIAGNOSIS — M24251 Disorder of ligament, right hip: Secondary | ICD-10-CM | POA: Diagnosis not present

## 2015-04-16 DIAGNOSIS — IMO0001 Reserved for inherently not codable concepts without codable children: Secondary | ICD-10-CM

## 2015-04-16 DIAGNOSIS — R269 Unspecified abnormalities of gait and mobility: Secondary | ICD-10-CM

## 2015-04-16 DIAGNOSIS — M25251 Flail joint, right hip: Secondary | ICD-10-CM

## 2015-04-16 NOTE — Therapy (Signed)
[place] MAIN [facility] SERVICES [address] [place], [place], [address] Phone: [phone]   Fax:  [phone]  Physical Therapy Treatment  Patient Details  Name: Bianca Brennan MRN: [id number] Date of Birth: 08-Jun-1980 Referring Provider: Leafy Ro  Encounter Date: 04/16/2015      PT End of Session - 04/16/15 2244    Visit Number 5   Number of Visits 12   PT Start Time 1700   PT Stop Time 3291   PT Time Calculation (min) 75 min   Activity Tolerance Patient tolerated treatment well;No increased pain   Behavior During Therapy [facility] for tasks assessed/performed      Past Medical History  Diagnosis Date  . 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 Anxiety     related to CLBP    Past Surgical History  Procedure Laterality Date  . 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  Visit Diagnosis:  No diagnosis found.      Subjective Assessment - 04/16/15 1706    Subjective Pt reported a relapse of RLE radiating "numbness"  last night after performing a deep squat to standing. It has persisted to this afternoon. 4/10.raidating from inner thigh to inner ankle. Pt also reports a twinge in the bilateral groin after this movement.      Pertinent History Currently, workout includes : 30 sec 5 reps planks, elliptical 5-6 days/ hr, 3# UE standing. Lifting 29yr30# out of bed, in/out car,    Patient Stated Goals minimize flare-ups (decreasing from 5-6 days/week to < 3 days/ week at 1-2/10)   and strengthen core to be ready to have 3rd child             [facility] - 04/16/15 2238    Palpation   SI assessment  PSIS aligned,   Palpation comment increased fascial tensions over suprapubic area                  Pelvic Floor Special Questions - 04/16/15 1748    Pelvic Floor Internal Exam pt consented verbally without contraindications   Strength weak squeeze, no lift  after manual Tx:  3/5 and pillow under hips    Strength # of reps 4   Strength # of seconds 10   Biofeedback required pillow under hips and manual Tx to create circumferential contraction            [facility] Adult PT Treatment/Exercise - 04/16/15 2241    Self-Care   Other Self-Care Comments  provided audio file   Therapeutic Activites    Other Therapeutic Activities variations to floor to rise using UE    Neuro Re-ed    Neuro Re-ed Details  pelvic floor endurance training review with a need for pillow under hips   guided body scan    Exercises   Exercises --  see pt instructions   Manual Therapy   Myofascial Release suprapubic area                 PT Education - 04/16/15 2244    Education provided Yes   Education Details HEP   Person(s) Educated Patient   Methods Explanation;Demonstration;Tactile cues;Verbal cues;Handout   Comprehension Verbalized understanding;Returned demonstration             PT Long Term Goals - 04/10/15 2048    PT LONG TERM GOAL #1  Title Pt will decrease her score on ZUNG Anxiety from 54% to  < 44% in order to improve QOL related to her functional mobility.    Time 12   Period Weeks   Status On-going   PT LONG TERM GOAL #2   Title Pt will decrease her score on [place] from 43% to < 33% in order to increase participation in family and community activities.    Time 12   Period Weeks   Status On-going   PT LONG TERM GOAL #3   Title Pt will decrease her score on  PGQ 47%  to < 37% in order to return ADLs.    Time 12   Period Weeks   Status On-going   PT LONG TERM GOAL #4   Title Pt will demo normal movement of  R PSIS on ilium with L SLS and R hip ext in order to improved pelvic stability to progress to loaded exercise.    Time 12   Period Weeks   Status Achieved   PT LONG TERM GOAL #5   Title Pt will demo no lumbopelvic instability with dynamic stability level 1-4 and  ASLR 5 reps with deep core activation without cuing in order to minimize relapse of  Sx.    Time 12   Period Weeks   Status On-going   PT LONG TERM GOAL #6   Title Pt will report pain that is at a 7/10 from 5-6 days/ week to < 3 days week in order to show improved self-management of pain.     Time 12   Period Weeks   Status Achieved   PT LONG TERM GOAL #7   Title Pt will be compliant with reading "Why I Still Hurt?" and practicing relaxation technique (body scan) for 5 days /week in order to show improved self-management of pain.    Time 12   Period Weeks   Status On-going   PT LONG TERM GOAL #8   Title Pt will be able to perform various positions in sitting on the floor, getting up from the floor, and reaching into low cabinets with proper body mechanics at < 2/10 pain  in order to perform ADLs and play with dtrs.    Time 12   Period Weeks   Status Partially Met   PT LONG TERM GOAL  #9   TITLE Pt will be able to perform a sequence of 5-10 yoga postures with proper alignment and deep core engagement in order to return to her hobby and fitness routine.    Time 12   Period Weeks   Status New               Plan - 04/16/15 2245    Clinical Impression Statement Pt had a flare -up yesterday following a deep squat to rise with a report of radiating numbness today. This Sx decreased from a 4/10 to 0/10 after pelvic floor endurance training and manual Tx. Pt showed a need to apply manual Tx to increase fascial mobility over suprapubic area and elevation of hips to reposition her bladder in more cephalad position in order to achieve a more circumferential contraction of her pelvic floor mm. Suspect she inccurred slight fascial strain with deep squat to rise incident.  Added resistive bridging exercises to HEP to faciliate fascial tensigrity and optimal bladder and pelvic floor mm position. Pt also reported feeling "relaxed and calm" after introduction of guided body scan. Pt would continue to benefit from a biopsychosocial approach  to her POC due to Hx of lumbopelvic girdle  injuries and obstretrics conditions.   Pt continues to maintain proper posture and pelvic girdle symmetry.  Reassess goals at next visit.    Pt will benefit from skilled therapeutic intervention in order to improve on the following deficits Decreased coordination;Decreased safety awareness;Hypermobility;Postural dysfunction;Improper body mechanics;Impaired perceived functional ability;Decreased range of motion;Decreased balance;Decreased mobility;Difficulty walking;Increased muscle spasms;Decreased strength;Pain;Hypomobility;Decreased endurance;Abnormal gait   Rehab Potential Good   PT Frequency 1x / week   PT Duration 12 weeks   PT Treatment/Interventions ADLs/Self Care Home Management;Neuromuscular re-education;Moist Heat;Traction;Balance training;Therapeutic exercise;Iontophoresis 93m/ml Dexamethasone;Electrical Stimulation;Cryotherapy;Stair training;Functional mobility training;Therapeutic activities;Manual techniques;Patient/family education;Passive range of motion;Taping;Energy conservation;Dry needling;Gait training   PT Next Visit Plan Address DRA and hypomobility at T spine, gait training, body mechanics with lifting   Consulted and Agree with Plan of Care Patient        Problem List There are no active problems to display for this patient.   YJerl Mina,PT, DPT, E-RYT  04/16/2015, 10:53 PM  CAskovMAIN RProv[facility] Medical CenterSERVICES [address] [address]Phone: [phone]  Fax:  [phone] Name: Bianca MathwigMRN: [id number] of Birth: 905-Apr-1982

## 2015-04-16 NOTE — Patient Instructions (Signed)
PELVIC FLOOR / KEGEL EXERCISES   Pelvic floor/ Kegel exercises are used to strengthen the muscles in the base of your pelvis that are responsible for supporting your pelvic organs and preventing urine/feces leakage. Based on your therapist's recommendations, they can be performed while standing, sitting, or lying down.  Make yourself aware of this muscle group by using these cues:  Imagine you are in a crowded room and you feel the need to pass gas. Your response is to pull up and in at the rectum.  Close the rectum. Pull the muscles up inside your body,feeling your scrotum lifting as well . Feel the pelvic floor muscles lift as if you were walking into a cold lake.  Place your hand on top of your pubic bone. Tighten and draw in the muscles around the anal muscles without squeezing the buttock muscles.  Common Errors:  Breath holding: If you are holding your breath, you may be bearing down against your bladder instead of pulling it up. If you belly bulges up while you are squeezing, you are holding your breath. Be sure to breathe gently in and out while exercising. Counting out loud may help you avoid holding your breath.  Accessory muscle use: You should not see or feel other muscle movement when performing pelvic floor exercises. When done properly, no one can tell that you are performing the exercises. Keep the buttocks, belly and inner thighs relaxed.  Overdoing it: Your muscles can fatigue and stop working for you if you over-exercise. You may actually leak more or feel soreness at the lower abdomen or rectum.  YOUR HOME EXERCISE PROGRAM  LONG HOLDS: Position: on back  Inhale and then exhale. Then squeeze the muscle and count aloud for 10 seconds. Rest with three long breaths. (Be sure to let belly sink in with exhales and not push outward)  Perform 4 repetitions, 3 times/day  SHORT HOLDS: Position: on back, sitting   Inhale and then exhale. Then squeeze the muscle.  (Be sure to  let belly sink in with exhales and not push outward)  Perform 5 repetitions, 5  Times/day  **ALSO SQUEEZE BEFORE YOUR SNEEZE, COUGH, LAUGH to decrease downward pressure   **ALSO EXHALE BEFORE YOU RISE AGAINST GRAVITY (lifting, sit to stand, from squat to stand)    _____________________  Red band bridging exercises by door knob 10 reps each level 1-2   X 1 x/rep   Lat pull down   -- double kneeling with toes tucked under and yellow band 20 reps x 1 /day  Multifidis twist    _____________________ Body scan practice before bed

## 2015-04-23 ENCOUNTER — Ambulatory Visit: Payer: Managed Care, Other (non HMO) | Admitting: Physical Therapy

## 2015-04-23 DIAGNOSIS — M24251 Disorder of ligament, right hip: Secondary | ICD-10-CM | POA: Diagnosis not present

## 2015-04-23 DIAGNOSIS — M25251 Flail joint, right hip: Secondary | ICD-10-CM

## 2015-04-23 DIAGNOSIS — IMO0001 Reserved for inherently not codable concepts without codable children: Secondary | ICD-10-CM

## 2015-04-23 DIAGNOSIS — R198 Other specified symptoms and signs involving the digestive system and abdomen: Secondary | ICD-10-CM

## 2015-04-23 DIAGNOSIS — R269 Unspecified abnormalities of gait and mobility: Secondary | ICD-10-CM

## 2015-04-24 NOTE — Therapy (Signed)
[place] MAIN [facility] SERVICES [address] [place], [place], [address] Phone: [phone]   Fax:  [phone]  Physical Therapy Treatment  Patient Details  Name: Bianca Brennan MRN: [id number] Date of Birth: Oct 11, 1980 Referring Provider: Leafy Ro  Encounter Date: 04/23/2015      PT End of Session - 04/24/15 2156    Visit Number 6   Number of Visits 12   Date for PT Re-Evaluation 06/08/15   PT Start Time 8372   PT Stop Time 9021   PT Time Calculation (min) 65 min      Past Medical History  Diagnosis Date  . 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 Anxiety     related to CLBP    Past Surgical History  Procedure Laterality Date  . 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  Visit Diagnosis:  Hip laxity, right  Abdominal weakness  Myalgia and myositis  Gait abnormality      Subjective Assessment - 04/23/15 1712    Subjective Pt felt calf numbness after working out and had a warm shower and it felt better. Pt noticed throbbing pain down her R leg which she wondered if it had to do with her period onset. The throbbing dissipated the next day.  Pt felt her twingey pain with floor to rise has "improved alot."     Pertinent History Currently, workout includes : 30 sec 5 reps planks, elliptical 5-6 days/ hr, 3# UE standing. Lifting 46yr30# out of bed, in/out car,    Patient Stated Goals minimize flare-ups (decreasing from 5-6 days/week to < 3 days/ week at 1-2/10)   and strengthen core to be ready to have 3rd child             [facility]PT [facility] - 04[facility] 2152    Observation/Other Assessments   Observations pronation of feet, required cuing for lower kinetic chain alignment in low lunge and dissociation betw trunk and lower body                     [facility] Adult PT Treatment/Exercise - 04/24/15 2153    Self-Care   Other Self-Care Comments  restorative pose for  relaxation    Therapeutic Activites    Other Therapeutic Activities modifications to seated postures on the floor to simulate reading to children while on the floor     Neuro Re-ed    Neuro Re-ed Details  alignment in yoga poses for core stability and pelvic girdle stability                      PT Long Term Goals - 04/10/15 2048    PT LONG TERM GOAL #1   Title Pt will decrease her score on ZUNG Anxiety from 54% to  < 44% in order to improve QOL related to her functional mobility.    Time 12   Period Weeks   Status On-going   PT LONG TERM GOAL #2   Title Pt will decrease her score on [place] 43% to < 33% in order to increase participation in family and community activities.    Time 12   Period Weeks   Status On-going   PT LONG TERM GOAL #3   Title Pt will decrease her score on  PGQ 47%  to < 37% in order to return ADLs.  Time 12   Period Weeks   Status On-going   PT LONG TERM GOAL #4   Title Pt will demo normal movement of  R PSIS on ilium with L SLS and R hip ext in order to improved pelvic stability to progress to loaded exercise.    Time 12   Period Weeks   Status Achieved   PT LONG TERM GOAL #5   Title Pt will demo no lumbopelvic instability with dynamic stability level 1-4 and  ASLR 5 reps with deep core activation without cuing in order to minimize relapse of Sx.    Time 12   Period Weeks   Status On-going   PT LONG TERM GOAL #6   Title Pt will report pain that is at a 7/10 from 5-6 days/ week to < 3 days week in order to show improved self-management of pain.     Time 12   Period Weeks   Status Achieved   PT LONG TERM GOAL #7   Title Pt will be compliant with reading "Why I Still Hurt?" and practicing relaxation technique (body scan) for 5 days /week in order to show improved self-management of pain.    Time 12   Period Weeks   Status On-going   PT LONG TERM GOAL #8   Title Pt will be able to perform various positions in sitting on the floor,  getting up from the floor, and reaching into low cabinets with proper body mechanics at < 2/10 pain  in order to perform ADLs and play with dtrs.    Time 12   Period Weeks   Status Partially Met   PT LONG TERM GOAL  #9   TITLE Pt will be able to perform a sequence of 5-10 yoga postures with proper alignment and deep core engagement in order to return to her hobby and fitness routine.    Time 12   Period Weeks   Status New               Plan - 04/24/15 2159    Clinical Impression Statement Pt continues to maintain pelvic girdle stability and improved posture with less lumbar lordosis. Focused on correcting pronation of feet and lower kinetic chain alignment to maintain pelvic girdle stability throughout various yoga poses.  Educated body mechanics with seating on the floor when reading to children to minimize relapse of pelvic girdle instability .  Pt demo'd correctly  with less cuing at the end of the session.  Educated about the role of hormones during menstrual cycle / pregnancy on pelvic girdle instability. Pt will continue to benefit from  more skilled PT to progress towards remaining goals.    Pt will benefit from skilled therapeutic intervention in order to improve on the following deficits Decreased coordination;Decreased safety awareness;Hypermobility;Postural dysfunction;Improper body mechanics;Impaired perceived functional ability;Decreased range of motion;Decreased balance;Decreased mobility;Difficulty walking;Increased muscle spasms;Decreased strength;Pain;Hypomobility;Decreased endurance;Abnormal gait   PT Frequency 1x / week   PT Duration 12 weeks   PT Treatment/Interventions ADLs/Self Care Home Management;Neuromuscular re-education;Moist Heat;Traction;Balance training;Therapeutic exercise;Iontophoresis 33m/ml Dexamethasone;Electrical Stimulation;Cryotherapy;Stair training;Functional mobility training;Therapeutic activities;Manual techniques;Patient/family education;Passive  range of motion;Taping;Energy conservation;Dry needling;Gait training        Problem List There are no active problems to display for this patient.   YJerl Mina,PT, DPT, E-RYT  04/24/2015, 10:10 PM  CWalnut CoveMAIN RMunising Memorial HospitalSERVICES [address] Theatre RoadRPunta [place]ord[place] [address]Phone: [phone]  Fax:  [phone] Name: Bianca CasselMRN: [id number] of  Birth: Nov 06, 1980

## 2015-04-24 NOTE — Patient Instructions (Addendum)
Handout of yoga sequence:  customized for pelvic stability on floor and low lunge poses, restorative postures (forward fold twist, legs in [place])

## 2015-05-07 ENCOUNTER — Ambulatory Visit: Payer: Managed Care, Other (non HMO) | Attending: Obstetrics and Gynecology | Admitting: Physical Therapy

## 2015-05-07 DIAGNOSIS — M791 Myalgia: Secondary | ICD-10-CM | POA: Diagnosis present

## 2015-05-07 DIAGNOSIS — M24251 Disorder of ligament, right hip: Secondary | ICD-10-CM | POA: Insufficient documentation

## 2015-05-07 DIAGNOSIS — R198 Other specified symptoms and signs involving the digestive system and abdomen: Secondary | ICD-10-CM | POA: Diagnosis present

## 2015-05-07 DIAGNOSIS — M25251 Flail joint, right hip: Secondary | ICD-10-CM

## 2015-05-07 DIAGNOSIS — R269 Unspecified abnormalities of gait and mobility: Secondary | ICD-10-CM | POA: Diagnosis present

## 2015-05-07 DIAGNOSIS — M609 Myositis, unspecified: Secondary | ICD-10-CM | POA: Insufficient documentation

## 2015-05-07 DIAGNOSIS — IMO0001 Reserved for inherently not codable concepts without codable children: Secondary | ICD-10-CM

## 2015-05-08 NOTE — Therapy (Addendum)
[place] MAIN [facility] SERVICES [address] [place], [place], [address] Phone: [phone]   Fax:  [phone]  Physical Therapy Treatment  Patient Details  Name: Laurelai Lepp MRN: [id number] Date of Birth: 23-May-1980 Referring Provider: Leafy Ro  Encounter Date: 05/07/2015      PT End of Session - 05/10/15 2027    Visit Number 7   Number of Visits 12   Date for PT Re-Evaluation 06/08/15   PT Start Time 3335   PT Stop Time 1750   PT Time Calculation (min) 45 min      Past Medical History  Diagnosis Date  . 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 Anxiety     related to CLBP    Past Surgical History  Procedure Laterality Date  . 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  Visit Diagnosis:  Hip laxity, right  Abdominal weakness  Gait abnormality  Myalgia and myositis      Subjective Assessment - 05/10/15 2012    Subjective Pt reported she noticed discomfort at bilateral PSIS area after last session and again after performing the yoga sequence on her own. Pt discontinued the routine and returned to performing previous HEP.     Pertinent History Currently, workout includes : 30 sec 5 reps planks, elliptical 5-6 days/ hr, 3# UE standing. Lifting 64yr30# out of bed, in/out car,    Patient Stated Goals minimize flare-ups (decreasing from 5-6 days/week to < 3 days/ week at 1-2/10)   and strengthen core to be ready to have 3rd child    Currently in Pain? No/denies            [facility]PT [facility] - 05/[facility]/15 2022    Observation/Other Assessments   Observations noted poor pelvic stability w/ decreased weight bearing through lower kinetic chain in yoga twists (half kneeling), lord of the fish pose.Noted inability to disassociate from lumbopelvic complex from thoracic spine in standing reverse triangle. Able to demo correctly w/ tactile and visual cues                      [facility] - 05/10/15 2025    Self-Care   Other Self-Care Comments  --   Neuro Re-ed    Neuro Re-ed Details  cues for increased pelvic stability, engagement of lowe kinetic chain, principles of different poses to maintain force closure of pelvic girdle.guided body scan, svasana    previous poses in HEP, cues for downward facing dog                 PT Education - 05/10/15 2027    Education provided Yes   Education Details HEP   Person(s) Educated Patient   Methods Explanation;Demonstration;Tactile cues;Verbal cues   Comprehension Returned demonstration;Verbalized understanding             PT Long Term Goals - 04/10/15 2048    PT LONG TERM GOAL #1   Title Pt will decrease her score on ZUNG Anxiety from 54% to  < 44% in order to improve QOL related to her functional mobility.    Time 12   Period Weeks   Status On-going   PT LONG TERM GOAL #2   Title Pt will decrease her score on PEufaulafrom 43% to < 33% in order to increase participation in family and community activities.  Time 12   Period Weeks   Status On-going   PT LONG TERM GOAL #3   Title Pt will decrease her score on  PGQ 47%  to < 37% in order to return ADLs.    Time 12   Period Weeks   Status On-going   PT LONG TERM GOAL #4   Title Pt will demo normal movement of  R PSIS on ilium with L SLS and R hip ext in order to improved pelvic stability to progress to loaded exercise.    Time 12   Period Weeks   Status Achieved   PT LONG TERM GOAL #5   Title Pt will demo no lumbopelvic instability with dynamic stability level 1-4 and  ASLR 5 reps with deep core activation without cuing in order to minimize relapse of Sx.    Time 12   Period Weeks   Status On-going   PT LONG TERM GOAL #6   Title Pt will report pain that is at a 7/10 from 5-6 days/ week to < 3 days week in order to show improved self-management of pain.     Time 12   Period Weeks   Status Achieved   PT LONG TERM GOAL #7    Title Pt will be compliant with reading "Why I Still Hurt?" and practicing relaxation technique (body scan) for 5 days /week in order to show improved self-management of pain.    Time 12   Period Weeks   Status On-going   PT LONG TERM GOAL #8   Title Pt will be able to perform various positions in sitting on the floor, getting up from the floor, and reaching into low cabinets with proper body mechanics at < 2/10 pain  in order to perform ADLs and play with dtrs.    Time 12   Period Weeks   Status Partially Met   PT LONG TERM GOAL  #9   TITLE Pt will be able to perform a sequence of 5-10 yoga postures with proper alignment and deep core engagement in order to return to her hobby and fitness routine.    Time 12   Period Weeks   Status New               Plan - 05/10/15 2027    Clinical Impression Statement Pt required further enducation on alignment and motor control for pelvic girdle stability in previous HEP yoga poses. Educated on principles to pelvic girdle stability mechanics to specific yoga postures with increased engagement in lower kinetic chain and dissociation of lumbopelvic complex from thoracic spine. Pt will continue to benefit from skilled PT.     Pt will benefit from skilled therapeutic intervention in order to improve on the following deficits Decreased coordination;Decreased safety awareness;Hypermobility;Postural dysfunction;Improper body mechanics;Impaired perceived functional ability;Decreased range of motion;Decreased balance;Decreased mobility;Difficulty walking;Increased muscle spasms;Decreased strength;Pain;Hypomobility;Decreased endurance;Abnormal gait   PT Frequency 1x / week   PT Duration 12 weeks   PT Treatment/Interventions ADLs/Self Care Home Management;Neuromuscular re-education;Moist Heat;Traction;Balance training;Therapeutic exercise;Iontophoresis 31m/ml Dexamethasone;Electrical Stimulation;Cryotherapy;Stair training;Functional mobility training;Therapeutic  activities;Manual techniques;Patient/family education;Passive range of motion;Taping;Energy conservation;Dry needling;Gait training        Problem List There are no active problems to display for this patient.   YJerl Mina,PT, DPT, E-RYT  05/10/2015, 8:31 PM  CVerde VillageMAIN RNashville Gastrointestinal Endoscopy CenterSERVICES [address] [address]Phone: [phone]  Fax:  [phone] Name: KKimberla DriskillMRN: [id number] of Birth: [birth year]

## 2015-05-14 ENCOUNTER — Ambulatory Visit: Payer: Managed Care, Other (non HMO) | Admitting: Physical Therapy

## 2015-05-21 ENCOUNTER — Ambulatory Visit: Payer: Managed Care, Other (non HMO) | Admitting: Physical Therapy

## 2015-05-28 ENCOUNTER — Ambulatory Visit: Payer: Managed Care, Other (non HMO) | Admitting: Physical Therapy

## 2015-05-28 DIAGNOSIS — M24251 Disorder of ligament, right hip: Secondary | ICD-10-CM | POA: Diagnosis not present

## 2015-05-28 DIAGNOSIS — R269 Unspecified abnormalities of gait and mobility: Secondary | ICD-10-CM

## 2015-05-28 DIAGNOSIS — R198 Other specified symptoms and signs involving the digestive system and abdomen: Secondary | ICD-10-CM

## 2015-05-28 DIAGNOSIS — M25251 Flail joint, right hip: Secondary | ICD-10-CM

## 2015-05-28 DIAGNOSIS — IMO0001 Reserved for inherently not codable concepts without codable children: Secondary | ICD-10-CM

## 2015-05-29 NOTE — Therapy (Signed)
[place] MAIN [facility] SERVICES [address] [place], [place], [address] Phone: [phone]   Fax:  [phone]  Physical Therapy Treatment  Patient Details  Name: Bianca Brennan MRN: [id number] Date of Birth: [birth year] Referring Provider: Leafy Ro  Encounter Date: 05/28/2015      PT End of Session - 05/29/15 0046    Visit Number 8   Number of Visits 12   Date for PT Re-Evaluation 06/08/15   PT Start Time 1700   PT Stop Time 1820   PT Time Calculation (min) 80 min   Activity Tolerance Patient tolerated treatment well;No increased pain   Behavior During Therapy [facility] for tasks assessed/performed      Past Medical History  Diagnosis Date  . 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 Anxiety     related to CLBP    Past Surgical History  Procedure Laterality Date  . 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  Visit Diagnosis:  Hip laxity, right  Gait abnormality  Myalgia and myositis  Abdominal weakness      Subjective Assessment - 05/28/15 1706    Subjective Pt reported having had one good month but this recent month, she has had 6 days/ week that were bad days which included Sx (numbness and radiating pain ) sometimes after yoga, consistently after squatting, sometimes with kneeling and occasionally with elliptical. Pt's husband massaged her deep external rotator mm and she had 2 good days after it.  Physical routine: 2x/ week of yoga with immediate pain on R side, 4x /week on elliptical with immediate pain 50% of the time, pelvic floor 10 sec, 5-7 reps  2-3x week.     Pertinent History Currently, workout includes : 30 sec 5 reps planks, elliptical 5-6 days/ hr, 3# UE standing. Lifting 78yr30# out of bed, in/out car,    Patient Stated Goals minimize flare-ups (decreasing from 5-6 days/week to < 3 days/ week at 1-2/10)   and strengthen core to be ready to have 3rd child              [facility] - 05/29/15 0030    Posture/Postural Control   Posture Comments oblique mm compensation with R hip flex (Deep core Level 3) , able to demo stability with tactile cuing    Palpation   SI assessment  R PSIS lower than L,  symmetrical post- Therapeutic ex,                     [facility] Adult PT Treatment/Exercise - 05/29/15 0030    Therapeutic Activites    Other Therapeutic Activities alternating half kneeling with LLE    Neuro Re-ed    Neuro Re-ed Details  postural stability with deep core 3, alternating half kneeling to opp leg in functional activities, MET on LLE, breathing cues (avoid holding tension in ab) when on elliptical, principle of stretching to decrease fear of stretching, principle of strenghtening hip abd/ER mm to counterbalance elliptical routine, post elliptical stretches, modified push up 5 x     Exercises   Exercises --  elliptical 5 mins, side step on treadmil 0.5 mph L 2', R 2'   Other Exercises  deep core 2-3 (30 rep)                 PT Education - 05/29/15 0046    Education provided  Yes   Education Details HEP   Person(s) Educated Patient   Methods Explanation;Demonstration;Tactile cues;Verbal cues   Comprehension Verbalized understanding;Returned demonstration             PT Long Term Goals - 05/28/15 1705    PT LONG TERM GOAL #1   Title Pt will decrease her score on ZUNG Anxiety from 54% to  < 44% in order to improve QOL related to her functional mobility.    Time 12   Period Weeks   Status On-going   PT LONG TERM GOAL #2   Title Pt will decrease her score on [place] from 43% to < 33% in order to increase participation in family and community activities.    Time 12   Period Weeks   Status On-going   PT LONG TERM GOAL #3   Title Pt will decrease her score on  PGQ 47%  to < 37% in order to return ADLs.    Time 12   Period Weeks   Status On-going   PT LONG TERM GOAL #4   Title Pt will demo normal movement of  R PSIS  on ilium with L SLS and R hip ext in order to improved pelvic stability to progress to loaded exercise.    Time 12   Period Weeks   Status Partially Met   PT LONG TERM GOAL #5   Title Pt will demo no lumbopelvic instability with dynamic stability level 1-4 and  ASLR 5 reps with deep core activation without cuing in order to minimize relapse of Sx.    Time 12   Period Weeks   Status Partially Met   PT LONG TERM GOAL #6   Title Pt will report pain that is at a 7/10 from 5-6 days/ week to < 3 days week in order to show improved self-management of pain.     Time 12   Period Weeks   Status Partially Met   PT LONG TERM GOAL #7   Title Pt will be compliant with reading "Why I Still Hurt?" and practicing relaxation technique (body scan) for 5 days /week in order to show improved self-management of pain.    Time 12   Period Weeks   Status Achieved   PT LONG TERM GOAL #8   Title Pt will be able to perform various positions in sitting on the floor, getting up from the floor, and reaching into low cabinets with proper body mechanics at < 2/10 pain  in order to perform ADLs and play with dtrs.    Time 12   Period Weeks   Status Partially Met   PT LONG TERM GOAL  #9   TITLE Pt will be able to perform a sequence of 5-10 yoga postures with proper alignment and deep core engagement in order to return to her hobby and fitness routine.    Time 12   Period Weeks   Status Partially Met               Plan - 05/29/15 0100    Clinical Impression Statement Pt had a relapse of Sx the past 2 weeks after pt tried incorporating yoga back into her routine and increasing her frequency of elliptical machine without maintaining deep core exercises. Prior to these two weeks, pt had 1 month of resolved Sx and no pelvic obliquity.  Today, pt showed pelvic obliquity but pt was able to independently realign her SIJ without PT applying manual Tx. PT guided her through deep  core exercises again in addition to MET  technique, stretches, hip abd/ ER strengthening, and modifications to her elliptical routine and half kneeling activities. Pt did not feel radiating pain post session and demo'd equally aligned PSIS. Yoga routine has been withheld and will address further hip strengthening exercises along with different yoga poses where pt will gain an understanding on how to counteract twisting poses that causes SIJ malalignment. Pt was educated on principles of stretching post elliptical training in order to decrease her fear to stretch. Pt required more focus on stability and motor control to accommodate for her hyperflexibility.  Pt will benefit from continued PT to progress towards her goals with a re-cert for more PT sessions.          Pt will benefit from skilled therapeutic intervention in order to improve on the following deficits Decreased coordination;Decreased safety awareness;Hypermobility;Postural dysfunction;Improper body mechanics;Impaired perceived functional ability;Decreased range of motion;Decreased balance;Decreased mobility;Difficulty walking;Increased muscle spasms;Decreased strength;Pain;Hypomobility;Decreased endurance;Abnormal gait   Rehab Potential Good   PT Frequency 1x / week   PT Duration 12 weeks   PT Treatment/Interventions ADLs/Self Care Home Management;Neuromuscular re-education;Moist Heat;Traction;Balance training;Therapeutic exercise;Iontophoresis 68m/ml Dexamethasone;Electrical Stimulation;Cryotherapy;Stair training;Functional mobility training;Therapeutic activities;Manual techniques;Patient/family education;Passive range of motion;Taping;Energy conservation;Dry needling;Gait training;Aquatic Therapy   Consulted and Agree with Plan of Care Patient        Problem List There are no active problems to display for this patient.   YJerl Mina,PT, DPT, E-RYT  05/29/2015, 1:08 AM  [place] [facility] [address] [place] [address]Phone: [phone]  Fax:  [phone] Name: KKianni LheureuxMRN: [id number] of Birth: [birth year]

## 2015-05-29 NOTE — Patient Instructions (Signed)
Warm up before elliptical: 10 min total  Sidestepping on treadmill L and R each side ( 0.5 mph , 5 min )  Elliptical (20-30 min) with trunk/ arm rotation , avoid holding tension in abdomen the whole time, just follow your breath, allow for inhalation and exhalation Last 10 min or when you feel tired: support hands on rails to maintain deep core activation   Stepping off elliptical properly  Stretching: 5 breaths each side Standing hip flexor (warrior stance)  Standing calf  Modified Pushup on counter  5 reps inhale down, exhale away, heels down, armpits engaged   Seated pirformis stretch (sense stability points in sitting bones and supporting ankle/feet under knee )  Seated cross over twist    ______________  [place] on yoga this week When half kneeling: sometimes lower with Left leg in front (alternate between R, and avoid always doing it on the R)  sidelying quad stretch    Sacroiliac joint stabilization/ correction on alignment  Deep core 2 and 3 (10 x 3)   If the PSIS on R is lower-->  Muscle Energy Technique:  Press hand onto L thigh while lifting L thigh into hand. Stabilizing with R knee bent, R feet press into floor with both PSIS pressed equally into floor  10 reps

## 2015-06-25 ENCOUNTER — Ambulatory Visit: Payer: Managed Care, Other (non HMO) | Attending: Obstetrics and Gynecology | Admitting: Physical Therapy

## 2015-06-25 DIAGNOSIS — M25251 Flail joint, right hip: Secondary | ICD-10-CM

## 2015-06-25 DIAGNOSIS — R198 Other specified symptoms and signs involving the digestive system and abdomen: Secondary | ICD-10-CM | POA: Diagnosis present

## 2015-06-25 DIAGNOSIS — M791 Myalgia: Secondary | ICD-10-CM | POA: Diagnosis present

## 2015-06-25 DIAGNOSIS — M24251 Disorder of ligament, right hip: Secondary | ICD-10-CM | POA: Diagnosis present

## 2015-06-25 DIAGNOSIS — M609 Myositis, unspecified: Secondary | ICD-10-CM | POA: Diagnosis present

## 2015-06-25 DIAGNOSIS — IMO0001 Reserved for inherently not codable concepts without codable children: Secondary | ICD-10-CM

## 2015-06-25 DIAGNOSIS — R269 Unspecified abnormalities of gait and mobility: Secondary | ICD-10-CM

## 2015-06-25 NOTE — Patient Instructions (Signed)
Running:  Cues for stabilizing ribcage over pelvis (suspenders )  Use exhale for deep core engagement Land soft Thigh hight to decrease (sway back)      Self-correction techniques for levelling PSIS to equal:  Muscle energy technique on back pushing L thigh 5 sec holds x 5   Diagonal lying w/ R leg dropped of side bed (30 sec)       Strengthening hip abductors: with PSIS aligned   Sidestep squat and grape vine (warm up running)   Prop on forearm clam shells 15 reps x 2  Left side only  Sidestepping to L only hallway   Sideplank (proper alignment , lowering carefully) Left

## 2015-06-26 NOTE — Therapy (Signed)
[place] MAIN [facility] SERVICES [address] [place], [place], [address] Phone: [phone]   Fax:  [phone]  Physical Therapy Treatment  Patient Details  Name: Bianca Brennan MRN: [id number] Date of Birth: 1981-03-20 Referring Provider: Leafy Ro  Encounter Date: 06/25/2015      PT End of Session - 06/26/15 2336    Visit Number 9   Number of Visits 12   Date for PT Re-Evaluation 06/08/15   PT Start Time 6701   PT Stop Time 1820   PT Time Calculation (min) 75 min   Activity Tolerance Patient tolerated treatment well;No increased pain   Behavior During Therapy [facility] for tasks assessed/performed      Past Medical History  Diagnosis Date  . 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 Anxiety     related to CLBP    Past Surgical History  Procedure Laterality Date  . 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  Visit Diagnosis:  Hip laxity, right  Gait abnormality  Myalgia and myositis  Abdominal weakness      Subjective Assessment - 06/26/15 2324    Subjective Pt reported her radiating pain goes away quicklly after elliptical with HEP stretches. Pt reported she tried running 3/4 mi and her L medial foot pain lasted for a few days only which is an improvement. Pt returned swimming freestyle, breaststroke, kickboard / buoy 2x week. Pt's R shoudler has improved as well.    Pertinent History Currently, workout includes : 30 sec 5 reps planks, elliptical 5-6 days/ hr, 3# UE standing. Lifting 39yr30# out of bed, in/out car,    Patient Stated Goals minimize flare-ups (decreasing from 5-6 days/week to < 3 days/ week at 1-2/10)   and strengthen core to be ready to have 3rd child             [facility] Assessment - 06/26/15 2334    Strength   Overall Strength Comments R hip abd 5/5, L  3/5    Flexibility   Quadriceps R hip ext limited due to quad tightness in sidelying    Ambulation/Gait   Gait Comments increased lumbar shearing at upper lumbar with running,. heaving landing w/ decreased depp core engaged (improved lumbopelvic stability and softer landing  after cues)                       [facility] PT Treatment/Exercise - 06/26/15 2330    Therapeutic Activites    Other Therapeutic Activities assessed feet, gait running mechanics, deferred running on treadmill after 8 min due to c/o pt feel claustrophobic, pt ran more naturally in outdoor environment and followed cues correctly   Neuro Re-ed    Neuro Re-ed Details  see pt instructions   Exercises   Other Exercises  supine AP hip mob Grade I, rotational mob in sidelying L R hip flexion                 PT Education - 06/26/15 2336    Education provided Yes   Education Details HEP   Person(s) Educated Patient   Methods Explanation;Demonstration;Tactile cues;Verbal cues;Handout   Comprehension Returned demonstration;Verbalized understanding             PT Long Term Goals - 05/28/15 1705    PT LONG TERM GOAL #1   Title Pt will decrease her score on  ZUNG Anxiety from 54% to  < 44% in order to improve QOL related to her functional mobility.    Time 12   Period Weeks   Status On-going   PT LONG TERM GOAL #2   Title Pt will decrease her score on [place] from 43% to < 33% in order to increase participation in family and community activities.    Time 12   Period Weeks   Status On-going   PT LONG TERM GOAL #3   Title Pt will decrease her score on  PGQ 47%  to < 37% in order to return ADLs.    Time 12   Period Weeks   Status On-going   PT LONG TERM GOAL #4   Title Pt will demo normal movement of  R PSIS on ilium with L SLS and R hip ext in order to improved pelvic stability to progress to loaded exercise.    Time 12   Period Weeks   Status Partially Met   PT LONG TERM GOAL #5   Title Pt will demo no lumbopelvic instability with dynamic stability level 1-4 and  ASLR 5 reps with deep core activation  without cuing in order to minimize relapse of Sx.    Time 12   Period Weeks   Status Partially Met   PT LONG TERM GOAL #6   Title Pt will report pain that is at a 7/10 from 5-6 days/ week to < 3 days week in order to show improved self-management of pain.     Time 12   Period Weeks   Status Partially Met   PT LONG TERM GOAL #7   Title Pt will be compliant with reading "Why I Still Hurt?" and practicing relaxation technique (body scan) for 5 days /week in order to show improved self-management of pain.    Time 12   Period Weeks   Status Achieved   PT LONG TERM GOAL #8   Title Pt will be able to perform various positions in sitting on the floor, getting up from the floor, and reaching into low cabinets with proper body mechanics at < 2/10 pain  in order to perform ADLs and play with dtrs.    Time 12   Period Weeks   Status Partially Met   PT LONG TERM GOAL  #9   TITLE Pt will be able to perform a sequence of 5-10 yoga postures with proper alignment and deep core engagement in order to return to her hobby and fitness routine.    Time 12   Period Weeks   Status Partially Met               Plan - 06/26/15 2343    Clinical Impression Statement Pt has been able to self-manage across the past month without c/o radiating pain and had minimal L foot pain.Pt rpesented to session with slight malalignment of PSIS (R lower than L) and was able to demo equal alignment with  manual Tx, new HEP, and neuromuscular training. Pt demo'd correct technique with running mechanics training (decreased lumbar shearing and more lumbopelvic stability). Pt will continue to benefit from skilled PT to achieve her reamining return to fitness goals.    Pt will benefit from skilled therapeutic intervention in order to improve on the following deficits Decreased coordination;Decreased safety awareness;Hypermobility;Postural dysfunction;Improper body mechanics;Impaired perceived functional ability;Decreased range of  motion;Decreased balance;Decreased mobility;Difficulty walking;Increased muscle spasms;Decreased strength;Pain;Hypomobility;Decreased endurance;Abnormal gait   Rehab Potential Good   PT Frequency 1x / week  PT Duration 12 weeks   PT Treatment/Interventions ADLs/Self Care Home Management;Neuromuscular re-education;Moist Heat;Traction;Balance training;Therapeutic exercise;Iontophoresis 45m/ml Dexamethasone;Electrical Stimulation;Cryotherapy;Stair training;Functional mobility training;Therapeutic activities;Manual techniques;Patient/family education;Passive range of motion;Taping;Energy conservation;Dry needling;Gait training;Aquatic Therapy   Consulted and Agree with Plan of Care Patient        Problem List There are no active problems to display for this patient.   YJerl Mina,PT, DPT, E-RYT  06/26/2015, 11:48 PM  [place]MAIN [facility] CenterSERVICES [address]RE[address] [place] [address]Phone: [phone]  Fax:  [phone] Name: Bianca WiemannMRN: [id number] of Birth: [birth year]

## 2015-07-02 ENCOUNTER — Ambulatory Visit: Payer: Managed Care, Other (non HMO) | Admitting: Physical Therapy

## 2015-07-15 ENCOUNTER — Telehealth: Payer: Self-pay | Admitting: Physical Therapy

## 2015-07-15 NOTE — Telephone Encounter (Signed)
PT f/u with pt and pt reported she has been having shoulder pain that started a year ago. PT explained she will ask for an order for shoulder pain.  Pt voiced understanding.

## 2015-07-16 ENCOUNTER — Encounter: Payer: Managed Care, Other (non HMO) | Admitting: Physical Therapy

## 2015-07-21 ENCOUNTER — Ambulatory Visit: Payer: Managed Care, Other (non HMO) | Attending: Obstetrics and Gynecology | Admitting: Physical Therapy

## 2015-07-21 DIAGNOSIS — R2689 Other abnormalities of gait and mobility: Secondary | ICD-10-CM | POA: Diagnosis present

## 2015-07-21 DIAGNOSIS — M25511 Pain in right shoulder: Secondary | ICD-10-CM | POA: Insufficient documentation

## 2015-07-21 DIAGNOSIS — M791 Myalgia, unspecified site: Secondary | ICD-10-CM

## 2015-07-21 DIAGNOSIS — R279 Unspecified lack of coordination: Secondary | ICD-10-CM | POA: Diagnosis present

## 2015-07-22 NOTE — Therapy (Addendum)
[place] MAIN [facility] SERVICES [address] [place], [place], [address] Phone: [phone]   Fax:  [phone]  Physical Therapy Treatment/ Progress Note  Patient Details  Name: Bianca Brennan MRN: [id number] Date of Birth: 1981/03/23 Referring Provider: Leafy Ro  Encounter Date: 07/21/2015      PT End of Session - 07/22/15 1628    Visit Number 10   Number of Visits 12   Date for PT Re-Evaluation    PT Start Time 1615   PT Stop Time [id number]   PT Time Calculation (min) 60 min   Activity Tolerance Patient tolerated treatment well;No increased pain   Behavior During Therapy [facility] for tasks assessed/performed      Past Medical History  Diagnosis Date  . 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 Anxiety     related to CLBP    Past Surgical History  Procedure Laterality Date  . 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      Subjective Assessment - 08/27/15 1605    Subjective Pt reported her sciatic Sx have not reoccurred after reintegrating running 1.5 mi, pilates mat workout. Pt experienced chronic R shoulder pain w/ pilates  and  L foot pain with increased distance in running 1.75 mi. Pt experiences anterior shoulder tenderness to the touch, laying on R side, wiping counters as well.      Pertinent History Currently, workout includes : 30 sec 5 reps planks, elliptical 5-6 days/ hr, 3# UE standing. Lifting 87yr 30# out of bed, in/out car,    Patient Stated Goals minimize flare-ups (decreasing from 5-6 days/week to < 3 days/ week at 1-2/10)   and strengthen core to be ready to have 3rd child             [facility] PT Assessment - 07/22/15 1605    Observation/Other Assessments   Other Surveys  --  ZUNG anxiety 43%, PGQ 0%, Quick Dash 25%    Other:   Other/ Comments lifting dtr with shoulder IR/ shoulder abd    Palpation   Palpation comment mm tensions at R teres minor, supraspinatus, levator  scaula   other   Comments p! lift off, scaption in prone, empty can, horizontal add, posterior apprehension sign  (decreased p! with scapula stabilized   post Tx: less p!                     [place] Adult PT Treatment/Exercise - 07/22/15 1613    Therapeutic Activites    Other Therapeutic Activities shoulder assessment   Neuro Re-ed    Neuro Re-ed Details  see pt instructions   Manual Therapy   Joint Mobilization AP mob grade III  in supine   Soft tissue mobilization STM release around shuhlder complex R at noted areas of tensions (pects, teres minor, supraspinatus, levator scapula R )                 PT Education - 07/22/15 1622    Education provided Yes   Education Details HEP   Person(s) Educated Patient   Methods Explanation;Demonstration;Tactile cues;Verbal cues;Handout   Comprehension Returned demonstration;Verbalized understanding             PT Long Term Goals - 07/22/15 1629    PT LONG TERM GOAL #1   Title Pt will decrease her score on ZUNG Anxiety from 54% to  <  44% in order to improve QOL related to her functional mobility.  (43% )   Time 12   Period Weeks   Status Achieved   PT LONG TERM GOAL #2   Title Pt will decrease her score on [place] from 43% to < 33% in order to increase participation in family and community activities.  (0%)    Time 12   Period Weeks   Status Achieved   PT LONG TERM GOAL #3   Title Pt will decrease her score on  PGQ 47%  to < 37% in order to return ADLs. (0%)    Time 12   Period Weeks   Status Achieved   PT LONG TERM GOAL #4   Title Pt will demo normal movement of  R PSIS on ilium with L SLS and R hip ext in order to improved pelvic stability to progress to loaded exercise.    Time 12   Period Weeks   Status Achieved   PT LONG TERM GOAL #5   Title Pt will demo no lumbopelvic instability with dynamic stability level 1-4 and  ASLR 5 reps with deep core activation without cuing in order to minimize relapse of Sx.     Time 12   Period Weeks   Status Achieved   Additional Long Term Goals   Additional Long Term Goals Yes   PT LONG TERM GOAL #6   Title Pt will report pain that is at a 7/10 from 5-6 days/ week to < 3 days week in order to show improved self-management of pain.     Time 12   Period Weeks   Status Achieved   PT LONG TERM GOAL #7   Title Pt will be compliant with reading "Why I Still Hurt?" and practicing relaxation technique (body scan) for 5 days /week in order to show improved self-management of pain.    Time 12   Period Weeks   Status Achieved   PT LONG TERM GOAL #8   Title Pt will be able to perform various positions in sitting on the floor, getting up from the floor, and reaching into low cabinets with proper body mechanics at < 2/10 pain  in order to perform ADLs and play with dtrs.    Time 12   Period Weeks   Status Achieved   PT LONG TERM GOAL  #9   TITLE Pt will be able to perform a sequence of 5-10 yoga postures with proper alignment and deep core engagement in order to return to her hobby and fitness routine.    Time 12   Period Weeks   Status Achieved   PT LONG TERM GOAL  #10   TITLE Pt will decrease her QUICK DASH score from 25% to < 15% in order to return to lifting dtr, household activities, fitness activities and sho without shoulder pain   Time 12   Period Weeks   Status New               Plan - 08/27/15 1634    Clinical Impression Statement Pt has achieved 100% of her original goals. Pt has been able to manage her LBP with fitness and functional activities and showed more symetrical alignment of her SIJ and lumbopelvic stability over the past several visits. Pt 's new goal is related to her R shoulder pain which limits her abiltiy to lay on her right side and wiping counters. Pt showed less pain and increased mobility with her R shoulder after manual  Tx to release mm tensions around shoulder complex in  combination with exercises to promote scapular  stabilization/ strengthening and neuromuscular re-education with arm mechanics. Pt will continue to benefit from skilled PT.        Pt will benefit from skilled therapeutic intervention in order to improve on the following deficits Decreased coordination;Decreased safety awareness;Hypermobility;Postural dysfunction;Improper body mechanics;Impaired perceived functional ability;Decreased range of motion;Decreased balance;Decreased mobility;Difficulty walking;Increased muscle spasms;Decreased strength;Pain;Hypomobility;Decreased endurance;Abnormal gait    Rehab Potential Good   PT Frequency 1x / week   PT Duration 12 weeks   PT Treatment/Interventions ADLs/Self Care Home Management;Neuromuscular re-education;Moist Heat;Traction;Balance training;Therapeutic exercise;Iontophoresis 4mg /ml Dexamethasone;Electrical Stimulation;Cryotherapy;Stair training;Functional mobility training;Therapeutic activities;Manual techniques;Patient/family education;Passive range of motion;Taping;Energy conservation;Dry needling;Gait training;Aquatic Therapy   Consulted and Agree with Plan of Care Patient         Patient will benefit from skilled therapeutic intervention in order to improve the following deficits and impairments:  Decreased coordination, Decreased safety awareness, Hypermobility, Postural dysfunction, Improper body mechanics, Impaired perceived functional ability, Decreased range of motion, Decreased balance, Decreased mobility, Difficulty walking, Increased muscle spasms, Decreased strength, Pain, Hypomobility, Decreased endurance, Abnormal gait  Visit Diagnosis: Unspecified lack of coordination  Myalgia  Other abnormalities of gait and mobility  Pain in right shoulder     Problem List There are no active problems to display for this patient.   Jerl Mina ,PT, DPT, E-RYT  08/27/2015, 4:40 PM  [place] MAIN [facility] SERVICES [address] [place], [place], [address] Phone: [phone]   Fax:  [phone]  Name: Bianca Brennan MRN: [id number] Date of Birth: 08-27-1980

## 2015-07-30 ENCOUNTER — Encounter: Payer: Managed Care, Other (non HMO) | Admitting: Physical Therapy

## 2015-08-27 ENCOUNTER — Ambulatory Visit: Payer: Managed Care, Other (non HMO) | Attending: Obstetrics and Gynecology | Admitting: Physical Therapy

## 2015-08-27 DIAGNOSIS — M791 Myalgia, unspecified site: Secondary | ICD-10-CM

## 2015-08-27 DIAGNOSIS — R279 Unspecified lack of coordination: Secondary | ICD-10-CM

## 2015-08-27 DIAGNOSIS — R2689 Other abnormalities of gait and mobility: Secondary | ICD-10-CM | POA: Diagnosis present

## 2015-08-27 DIAGNOSIS — M25511 Pain in right shoulder: Secondary | ICD-10-CM

## 2015-08-27 NOTE — Patient Instructions (Addendum)
Retraction band:  10 x 2 sets (pick 2)   1. Chest rows at doorknob (dont let shoulders shrug up)  2. Pulling the dough ( fingers curl under, thumbs down)  3. Side step accordian (loop as an "X", thumbs up) 4. Half bent over bench: elbow to sky with double band as if you were tricep strengthening position    Lower traps/ lats: (pick 1)  1. Band over top of door, pull down by pockets 2. Laying on your band "A" to "W" with deep core engaged    Triceps: (pick1)  1.Half bent over bench:  Straighten elbow.  double band  2. tricep dips over yoga blocks   Functional: (pick 2)  1. Counter sideplank/ side pushup R  10x  2. Push up --> push up plus for serratus anterior strengthening  (spiderwoman hand position, ballmounds engaged)   10x  3. [place]. crabcrawling  4. Tricep scooting game    ** pulling doors or lifting heavy pot/pan  with weight on back leg, pulling from across midline**

## 2015-08-27 NOTE — Patient Instructions (Signed)
External rotation with yellow band, towel under elbow (R)  15 x 2 reps   SCaption plan (Pre-hug plane) -latissmus pull down 15 x 2 w/ red band over top of door   Kettle bell 10lb side step lifting / lowering on to low bench w/ elbows close to ribs   2 x day   ________  Picking up daughter Ayesha Rumpf with elbows close to ribs, shoulder blades on back  Logrolling w. R arm past the head and then roll over with L hand reaching

## 2015-08-27 NOTE — Therapy (Signed)
[place] MAIN [facility] SERVICES [address] [place], [place], [address] Phone: [phone]   Fax:  [phone]  Physical Therapy Treatment  Patient Details  Name: Bianca Brennan MRN: [id number] Date of Birth: 1980-08-02 Referring Provider: Leafy Ro  Encounter Date: 08/27/2015      PT End of Session - 08/27/15 2114    Visit Number 11   Number of Visits 12   PT Start Time [id number]   PT Stop Time [id number]   PT Time Calculation (min) 65 min      Past Medical History  Diagnosis Date  . 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 Anxiety     related to CLBP    Past Surgical History  Procedure Laterality Date  . 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      Subjective Assessment - 08/27/15 1714    Subjective Pt reported she had been doing her shoulder exercises since last session (external rotation) . Pt noticed her shoulder bothered her more the week she swam 4x a week (breast and free style stroke) for 1-1.25 miles.  Pt feels this week, her shoulder is slightly tender and has improved 70%. Pt is no longer bothered by shoulder IR.      Pertinent History Currently, workout includes : 30 sec 5 reps planks, elliptical 5-6 days/ hr, 3# UE standing. Lifting 60yr 30# out of bed, in/out car,    Patient Stated Goals minimize flare-ups (decreasing from 5-6 days/week to < 3 days/ week at 1-2/10)   and strengthen core to be ready to have 3rd child             [facility] PT Assessment - 08/27/15 2109    Coordination   Gross Motor Movements are Fluid and Coordinated --  scapular dyskinesis on R with PNF D1 flexion   Other:   Other/ Comments wall pushups with poor UE kinetic engagement    Strength   Overall Strength Comments shoulder scaption in prone resisted  4/5 B, all shoudler planes resisted , lift off no pain and normal strength   Palpation   Palpation comment no mm tensions noted on problem areas at last  session                      [facility] Adult PT Treatment/Exercise - 08/27/15 2113    Therapeutic Activites    Other Therapeutic Activities see pt instructions including pulling doors    Neuro Re-ed    Neuro Re-ed Details  see pt instructions                 PT Education - 08/27/15 2114    Education provided Yes   Education Details HEP   Person(s) Educated Patient   Methods Explanation;Demonstration;Tactile cues;Verbal cues;Handout   Comprehension Returned demonstration;Verbalized understanding             PT Long Term Goals - 08/27/15 1629    PT LONG TERM GOAL #1   Title Pt will decrease her score on ZUNG Anxiety from 54% to  < 44% in order to improve QOL related to her functional mobility.  (43% )   Time 12   Period Weeks   Status Achieved   PT LONG TERM GOAL #2   Title Pt will decrease her score on [place] from 43% to < 33% in order to increase participation in  family and community activities.  (0%)    Time 12   Period Weeks   Status Achieved   PT LONG TERM GOAL #3   Title Pt will decrease her score on  PGQ 47%  to < 37% in order to return ADLs. (0%)    Time 12   Period Weeks   Status Achieved   PT LONG TERM GOAL #4   Title Pt will demo normal movement of  R PSIS on ilium with L SLS and R hip ext in order to improved pelvic stability to progress to loaded exercise.    Time 12   Period Weeks   Status Achieved   PT LONG TERM GOAL #5   Title Pt will demo no lumbopelvic instability with dynamic stability level 1-4 and  ASLR 5 reps with deep core activation without cuing in order to minimize relapse of Sx.    Time 12   Period Weeks   Status Achieved   Additional Long Term Goals   Additional Long Term Goals Yes   PT LONG TERM GOAL #6   Title Pt will report pain that is at a 7/10 from 5-6 days/ week to < 3 days week in order to show improved self-management of pain.     Time 12   Period Weeks   Status Achieved   PT LONG TERM GOAL #7   Title Pt will  be compliant with reading "Why I Still Hurt?" and practicing relaxation technique (body scan) for 5 days /week in order to show improved self-management of pain.    Time 12   Period Weeks   Status Achieved   PT LONG TERM GOAL #8   Title Pt will be able to perform various positions in sitting on the floor, getting up from the floor, and reaching into low cabinets with proper body mechanics at < 2/10 pain  in order to perform ADLs and play with dtrs.    Time 12   Period Weeks   Status Achieved   PT LONG TERM GOAL  #9   TITLE Pt will be able to perform a sequence of 5-10 yoga postures with proper alignment and deep core engagement in order to return to her hobby and fitness routine.    Time 12   Period Weeks   Status Achieved   PT LONG TERM GOAL  #10   TITLE Pt will decrease her QUICK DASH score from 25% to < 15% in order to return to lifting dtr, household activities, fitness activities and sho without shoulder pain   Time 12   Period Weeks   Status New               Plan - 08/27/15 2115    Clinical Impression Statement Pt continues to not have any pelvic/LBP pain and has achieved 100% of her original goals. Pt is now working on her R shoulder issue which has improved since last session. Pt  showed significantly improved R shoulder strength, ROM and no pain with resisted tests. However, pt showed R scapular dyskinesis with from PNF D1 ext->  PNF D1 flexion with resisted band.New  HEP includes retraction and scapular stabilizing exercises in addition to closed chain, body weight training functional activities. Pt demo'd ex with proper form and required minimal cuing. Pt will continue to benefit from skilled PT and aquatic PT to learn proper swimming techniques to minimize risk for shoulder injuries.    Rehab Potential Good   PT Frequency 1x / week  PT Duration 12 weeks   PT Treatment/Interventions ADLs/Self Care Home Management;Neuromuscular re-education;Moist Heat;Traction;Balance  training;Therapeutic exercise;Iontophoresis 4mg /ml Dexamethasone;Electrical Stimulation;Cryotherapy;Stair training;Functional mobility training;Therapeutic activities;Manual techniques;Patient/family education;Passive range of motion;Taping;Energy conservation;Dry needling;Gait training;Aquatic Therapy   Consulted and Agree with Plan of Care Patient      Patient will benefit from skilled therapeutic intervention in order to improve the following deficits and impairments:  Decreased coordination, Decreased safety awareness, Hypermobility, Postural dysfunction, Improper body mechanics, Impaired perceived functional ability, Decreased range of motion, Decreased balance, Decreased mobility, Difficulty walking, Increased muscle spasms, Decreased strength, Pain, Hypomobility, Decreased endurance, Abnormal gait  Visit Diagnosis: Myalgia  Other abnormalities of gait and mobility  Pain in right shoulder  Unspecified lack of coordination     Problem List There are no active problems to display for this patient.   Jerl Mina ,PT, DPT, E-RYT  08/27/2015, 9:28 PM  [place] MAIN [facility] SERVICES [address] [place], [place], [address] Phone: [phone]   Fax:  [phone]  Name: Bianca Brennan MRN: [id number] Date of Birth: 12-23-1980

## 2016-03-10 ENCOUNTER — Other Ambulatory Visit: Payer: Self-pay | Admitting: Orthopedic Surgery

## 2016-03-10 DIAGNOSIS — M778 Other enthesopathies, not elsewhere classified: Secondary | ICD-10-CM

## 2016-03-10 DIAGNOSIS — M7581 Other shoulder lesions, right shoulder: Principal | ICD-10-CM

## 2016-03-11 ENCOUNTER — Ambulatory Visit
Admission: RE | Admit: 2016-03-11 | Discharge: 2016-03-11 | Disposition: A | Discharge status: Home / Self Care | Payer: Managed Care, Other (non HMO) | Source: Ambulatory Visit | Attending: Orthopedic Surgery | Admitting: Orthopedic Surgery

## 2016-03-11 DIAGNOSIS — M13811 Other specified arthritis, right shoulder: Secondary | ICD-10-CM | POA: Insufficient documentation

## 2016-03-11 DIAGNOSIS — M7581 Other shoulder lesions, right shoulder: Secondary | ICD-10-CM | POA: Diagnosis not present

## 2016-03-11 DIAGNOSIS — M778 Other enthesopathies, not elsewhere classified: Secondary | ICD-10-CM

## 2016-10-25 ENCOUNTER — Other Ambulatory Visit: Payer: Self-pay | Admitting: Obstetrics and Gynecology

## 2016-10-25 DIAGNOSIS — Z369 Encounter for antenatal screening, unspecified: Secondary | ICD-10-CM

## 2016-11-03 ENCOUNTER — Ambulatory Visit
Admission: RE | Admit: 2016-11-03 | Discharge: 2016-11-03 | Disposition: A | Discharge status: Home / Self Care | Payer: Managed Care, Other (non HMO) | Source: Ambulatory Visit | Attending: Obstetrics and Gynecology | Admitting: Obstetrics and Gynecology

## 2016-11-03 ENCOUNTER — Ambulatory Visit (HOSPITAL_BASED_OUTPATIENT_CLINIC_OR_DEPARTMENT_OTHER)
Admission: RE | Admit: 2016-11-03 | Discharge: 2016-11-03 | Disposition: A | Discharge status: Home / Self Care | Payer: Managed Care, Other (non HMO) | Source: Ambulatory Visit | Attending: Obstetrics and Gynecology | Admitting: Obstetrics and Gynecology

## 2016-11-03 DIAGNOSIS — Z369 Encounter for antenatal screening, unspecified: Secondary | ICD-10-CM | POA: Diagnosis present

## 2016-11-03 DIAGNOSIS — Z3A14 14 weeks gestation of pregnancy: Secondary | ICD-10-CM | POA: Insufficient documentation

## 2016-11-03 DIAGNOSIS — O09522 Supervision of elderly multigravida, second trimester: Secondary | ICD-10-CM | POA: Diagnosis not present

## 2016-11-03 HISTORY — DX: Autoimmune thyroiditis: E06.3

## 2016-11-03 NOTE — Progress Notes (Addendum)
Referring Provider:  Rusty Aus Length of Consultation: 40 minutes  Ms. Maka was referred to [facility] for genetic counseling because of advanced maternal age.  The patient will be 36 years old at the time of delivery.  This note summarizes the information we discussed.    We explained that the chance of a chromosome abnormality increases with maternal age.  Chromosomes and examples of chromosome problems were reviewed.  Humans typically have 46 chromosomes in each cell, with half passed through each sperm and egg.  Any change in the number or structure of chromosomes can increase the risk of problems in the physical and mental development of a pregnancy.   Based upon age of the patient, the chance of any chromosome abnormality was 1 in 28. The chance of Down syndrome, the most common chromosome problem associated with maternal age, was 1 in 6.  The risk of chromosome problems is in addition to the 3% general population risk for birth defects and developmental delays.  The greatest chance, of course, is that the baby would be born in good health.  We discussed the following prenatal screening and testing options for this pregnancy:  First trimester screening, which includes nuchal translucency ultrasound screen and first trimester maternal serum marker screening.  The nuchal translucency has approximately an 80% detection rate for Down syndrome and can be positive for other chromosome abnormalities as well as heart defects.  When combined with a maternal serum marker screening, the detection rate is up to 90% for Down syndrome and up to 97% for trisomy 18.     The chorionic villus sampling procedure is available for first trimester chromosome analysis.  This involves the withdrawal of a small amount of chorionic villi (tissue from the developing placenta).  Risk of pregnancy loss is estimated to be approximately 1 in 200 to 1 in 100 (0.5 to 1%).  There is  approximately a 1% (1 in 100) chance that the CVS chromosome results will be unclear.  Chorionic villi cannot be tested for neural tube defects.     Maternal serum marker screening, a blood test that measures pregnancy proteins, can provide risk assessments for Down syndrome, trisomy 18, and open neural tube defects (spina bifida, anencephaly). Because it does not directly examine the fetus, it cannot positively diagnose or rule out these problems.  Targeted ultrasound uses high frequency sound waves to create an image of the developing fetus.  An ultrasound is often recommended as a routine means of evaluating the pregnancy.  It is also used to screen for fetal anatomy problems (for example, a heart defect) that might be suggestive of a chromosomal or other abnormality.   Amniocentesis involves the removal of a small amount of amniotic fluid from the sac surrounding the fetus with the use of a thin needle inserted through the maternal abdomen and uterus.  Ultrasound guidance is used throughout the procedure.  Fetal cells from amniotic fluid are directly evaluated and > 99.5% of chromosome problems and > 98% of open neural tube defects can be detected. This procedure is generally performed after the 15th week of pregnancy.  The main risks to this procedure include complications leading to miscarriage in less than 1 in 200 cases (0.5%).  We also reviewed the availability of cell free fetal DNA testing from maternal blood to determine whether or not the baby may have either Down syndrome, trisomy 52, or trisomy 70.  This test utilizes a maternal blood sample and DNA  sequencing technology to isolate circulating cell free fetal DNA from maternal plasma.  The fetal DNA can then be analyzed for DNA sequences that are derived from the three most common chromosomes involved in aneuploidy, chromosomes 13, 18, and 21.  If the overall amount of DNA is greater than the expected level for any of these chromosomes,  aneuploidy is suspected.  While we do not consider it a replacement for invasive testing and karyotype analysis, a negative result from this testing would be reassuring, though not a guarantee of a normal chromosome complement for the baby.  An abnormal result is certainly suggestive of an abnormal chromosome complement, though we would still recommend CVS or amniocentesis to confirm any findings from this testing.  Cystic Fibrosis and Spinal Muscular Atrophy (SMA) screening were also discussed with the patient, as she and her husband are of European ancestry. Both conditions are recessive, which means that both parents must be carriers in order to have a child with the disease.  Cystic fibrosis (CF) is one of the most common genetic conditions in persons of Caucasian ancestry.  This condition occurs in approximately 1 in 2,500 Caucasian persons and results in thickened secretions in the lungs, digestive, and reproductive systems.  For a baby to be at risk for having CF, both of the parents must be carriers for this condition.  Approximately 1 in 44 Caucasian persons is a carrier for CF.  Current carrier testing looks for the most common mutations in the gene for CF and can detect approximately 90% of carriers in the Caucasian population.  This means that the carrier screening can greatly reduce, but cannot eliminate, the chance for an individual to have a child with CF.  If an individual is found to be a carrier for CF, then carrier testing would be available for the partner. As part of [place] newborn screening profile, all babies born in the state of [place] will have a two-tier screening process.  Specimens are first tested to determine the concentration of immunoreactive trypsinogen (IRT).  The top 5% of specimens with the highest IRT values then undergo DNA testing using a panel of over 40 common CF mutations. SMA is a neurodegenerative disorder that leads to atrophy of skeletal muscle and  overall weakness.  This condition is also more prevalent in the Caucasian population, with 1 in 40-1 in 60 persons being a carrier and 1 in 6,000-1 in 10,000 children being affected.  There are multiple forms of the disease, with some causing death in infancy to other forms with survival into adulthood.  The genetics of SMA is complex, but carrier screening can detect up to 95% of carriers in the Caucasian population.  Similar to CF, a negative result can greatly reduce, but cannot eliminate, the chance to have a child with SMA.  We obtained a detailed family history and pregnancy history.  The patient reported that her brother has a daughter with a possible neuromuscular condition.  She has no cognitive delays, but has physical differences which require therapy and may be placed in a special preschool to ensure she gets needed PT.  Ms. Fayette said that her brother is very private about this condition and has not told her anything about the diagnosis, prognosis or cause for her condition.  Without specific information about the diagnosis, it is not possible to determine an accurate recurrence chance for other family members, but if more is learned, we are happy to discuss this further.  She also reported  a paternal first cousin with mild autistic features.  He lives and works independently and has no known underlying genetic condition or intellectual delays.  We reviewed that there may be many causes for autism spectrum disorders including genetic conditions like Fragile X.  However, this cousin does not have features consistent with Fragile X.  If he were to have any genetic testing, we are happy to review it and discuss any implications for other family members. Lastly, the patient stated that the father of the baby has a brother who was born with kidney problems.  He has a daughter who was born with a cleft soft palate and hearing loss.  Ms. Simar believes that some genetic testing was done for a condition  that explained both the hearing loss and kidney problems, but is not aware of the name for the condition.  We are happy to review medical records to ensure this is not a condition that would potentially impact this pregnancy if desired.  The remainder of the family history is unremarkable for birth defects, developmental delays, recurrent pregnancy loss or known chromosome abnormalities.  Ms. Weisensel stated that this is her fourth pregnancy. The couple has two healthy daughters, age 50 and 64, and had one early miscarriage.  She reported no complications or exposures in this pregnancy that would be expected to increase the risk for birth defects.  After consideration of the options, Ms. Auzenne elected to proceed with cell free DNA testing, CF and SMA carrier screening.  An ultrasound was performed at the time of the visit.  The gestational age was consistent with  41 weeks.  Fetal anatomy could not be assessed due to early gestational age.  Please refer to the ultrasound report for details of that study.  Ms. Tash was encouraged to call with questions or concerns.  We can be contacted at [phone].   Wilburt Finlay, MS, CGC Pt seen pursuing cell free dna - I counseled regarding anatomy ultrasound Gatha Mayer, MD

## 2016-11-10 LAB — INFORMASEQ(SM) WITH XY ANALYSIS
FETAL FRACTION (%): 9.3
Fetal Number: 1
GESTATIONAL AGE AT COLLECTION: 13.1 wk
WEIGHT: 129 [lb_av]

## 2016-11-10 LAB — CYSTIC FIBROSIS GENE TEST

## 2016-11-14 ENCOUNTER — Telehealth: Payer: Self-pay | Admitting: Obstetrics and Gynecology

## 2016-11-14 LAB — SMN1 COPY NUMBER ANALYSIS (SMA CARRIER SCREENING)

## 2016-11-14 NOTE — Telephone Encounter (Signed)
Results of the recent cell free fetal DNA testing and carrier screening for Bianca Brennan are now available and were given to her by phone today.  The patient was informed of the results of her recent InformaSeq testing (performed at [facility]) which yielded NEGATIVE results.  The patient's specimen showed DNA consistent with two copies of chromosomes 21, 18 and 13.  The sensitivity for trisomy 3, trisomy 13 and trisomy 52 using this testing are reported as 99.1%, 98.3% and 98.1% respectively.  Thus, while the results of this testing are highly accurate, they are not considered diagnostic at this time.  Should more definitive information be desired, the patient may still consider amniocentesis.   As requested to know by the patient, sex chromosome analysis was included for this sample.  Results were consistent with two sex chromosomes; however, the patient asked that the gender be placed in an envelope for her to pick up from our office later then week.  The GENDER WAS NOT GIVEN to her today by phone.  Gender is predicted with >97% accuracy.  A maternal serum AFP only should be considered if screening for neural tube defects is desired in the second trimester.   She also elected to have CF carrier screening.  CF is a genetic condition that occurs most often in Caucasian persons.  It primarily affects the lungs, digestive, and reproductive systems.  For someone to be at risk for having CF, both of their parents must be carriers for CF.  The testing can detect many persons who are carriers for CF and therefore determine if the pregnancy is at an increased risk for this condition.    The blood test results were negative when examined for the 32 most common mutations (or changes) in the gene for CF.  This means that she does not carry any of the most common changes in this gene.  Testing for these 32 mutations detects approximately 90% of carriers who are Caucasian.  Therefore, the chance that she is a carrier  based on this negative result has been reduced from 1 in 25 to approximately 1 in 240.  Because this testing cannot detect all changes that may cause CF, we cannot eliminate the chance that this individual is a carrier completely.  Lastly, the results of the SMA carrier screening are also available.  SMA is also a recessive genetic condition with variable age of onset and severity caused by mutations in the SMN1 gene.  This carrier testing assesses the number of copies of this gene.  Persons with one copy of the SMN1 gene are carriers, and those with no copies are affected with the condition.  Individuals with two or more copies have a reduced chance to be a carrier.  Not all mutations can be detected with this testing, though it can detect 94.8% of carriers in the Caucasian population.  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  Again, this testing cannot eliminate the chance to have a child with SMA, but dramatically reduces the chance.    We encouraged the patient to call with any questions or concerns as they arise.  We may be reached at [phone].  Wilburt Finlay, MS, CGC

## 2017-04-04 NOTE — L&D Delivery Note (Signed)
Delivery Note  First Stage: Labor onset: 0630  Augmentation : none Analgesia /Anesthesia intrapartum: epidural SROM at 1050, 04/29/17  Second Stage: Complete dilation at 1107 Onset of pushing at 1110 FHR second stage: Cat I then onset of variables about 15 min prior to delivery, moderate variability throughout.   Delivery of a viable female infant, 04/29/17 at 1206 by Hassan Buckler CNM.  delivery of fetal head in ROP position with restitution to ROT, no nuchal cord;  Anterior then posterior shoulders delivered easily with gentle downward traction. Baby placed on mom's chest, and attended to by peds.  Cord double clamped after cessation of pulsation, cut by FOB.  Third Stage: Placenta delivered spontaneously intact with 3 VC @ 1217. Placenta disposition: disposal.  Uterine tone Firm / bleeding Mod  Left periurethral laceration identified  Anesthesia for repair: epidural Repair: 4-0 Vicryl Est. Blood Loss (mL): 977  Complications: none  Mom to postpartum.  Baby to Couplet care / Skin to Skin.  Newborn: Birth Weight: pending  Apgar Scores: 8/9 Feeding planned: breast   McVey, Fisher, CNM 04/29/17 12:36 PM

## 2017-04-29 ENCOUNTER — Inpatient Hospital Stay: Payer: No Typology Code available for payment source | Admitting: Anesthesiology

## 2017-04-29 ENCOUNTER — Inpatient Hospital Stay
Admission: EM | Admit: 2017-04-29 | Discharge: 2017-05-01 | DRG: 807 | Disposition: A | Discharge status: Home / Self Care | Payer: No Typology Code available for payment source | Attending: Obstetrics and Gynecology | Admitting: Obstetrics and Gynecology

## 2017-04-29 ENCOUNTER — Other Ambulatory Visit: Payer: Self-pay

## 2017-04-29 DIAGNOSIS — O99284 Endocrine, nutritional and metabolic diseases complicating childbirth: Secondary | ICD-10-CM | POA: Diagnosis present

## 2017-04-29 DIAGNOSIS — D649 Anemia, unspecified: Secondary | ICD-10-CM | POA: Diagnosis present

## 2017-04-29 DIAGNOSIS — O9902 Anemia complicating childbirth: Secondary | ICD-10-CM | POA: Diagnosis present

## 2017-04-29 DIAGNOSIS — E063 Autoimmune thyroiditis: Secondary | ICD-10-CM | POA: Diagnosis present

## 2017-04-29 DIAGNOSIS — G8929 Other chronic pain: Secondary | ICD-10-CM | POA: Diagnosis present

## 2017-04-29 DIAGNOSIS — Z3483 Encounter for supervision of other normal pregnancy, third trimester: Secondary | ICD-10-CM | POA: Diagnosis present

## 2017-04-29 DIAGNOSIS — O99824 Streptococcus B carrier state complicating childbirth: Secondary | ICD-10-CM | POA: Diagnosis present

## 2017-04-29 DIAGNOSIS — M545 Low back pain: Secondary | ICD-10-CM | POA: Diagnosis present

## 2017-04-29 DIAGNOSIS — O09522 Supervision of elderly multigravida, second trimester: Secondary | ICD-10-CM | POA: Diagnosis present

## 2017-04-29 DIAGNOSIS — Z3A39 39 weeks gestation of pregnancy: Secondary | ICD-10-CM | POA: Diagnosis not present

## 2017-04-29 LAB — CBC
HCT: 33.1 % — ABNORMAL LOW (ref 35.0–47.0)
HEMOGLOBIN: 10.6 g/dL — AB (ref 12.0–16.0)
MCH: 26 pg (ref 26.0–34.0)
MCHC: 32 g/dL (ref 32.0–36.0)
MCV: 81.2 fL (ref 80.0–100.0)
Platelets: 160 10*3/uL (ref 150–440)
RBC: 4.07 MIL/uL (ref 3.80–5.20)
RDW: 14.9 % — ABNORMAL HIGH (ref 11.5–14.5)
WBC: 13.6 10*3/uL — ABNORMAL HIGH (ref 3.6–11.0)

## 2017-04-29 LAB — TYPE AND SCREEN
ABO/RH(D): B POS
ANTIBODY SCREEN: NEGATIVE

## 2017-04-29 MED ORDER — TIZANIDINE HCL 2 MG PO TABS
2.0000 mg | ORAL_TABLET | Freq: Three times a day (TID) | ORAL | Status: DC
  Administered 2017-04-29 – 2017-05-01 (×5): 2 mg via ORAL
  Filled 2017-04-29 (×8): qty 1

## 2017-04-29 MED ORDER — PRENATAL MULTIVITAMIN CH
1.0000 | ORAL_TABLET | Freq: Every day | ORAL | Status: DC
  Administered 2017-04-29 – 2017-05-01 (×3): 1 via ORAL
  Filled 2017-04-29 (×3): qty 1

## 2017-04-29 MED ORDER — ZOLPIDEM TARTRATE 5 MG PO TABS
5.0000 mg | ORAL_TABLET | Freq: Every evening | ORAL | Status: DC | PRN
  Administered 2017-04-29 – 2017-04-30 (×2): 5 mg via ORAL
  Filled 2017-04-29 (×2): qty 1

## 2017-04-29 MED ORDER — OXYTOCIN 40 UNITS IN LACTATED RINGERS INFUSION - SIMPLE MED
2.5000 [IU]/h | INTRAVENOUS | Status: DC
  Administered 2017-04-29: 2.5 [IU]/h via INTRAVENOUS

## 2017-04-29 MED ORDER — SENNOSIDES-DOCUSATE SODIUM 8.6-50 MG PO TABS
2.0000 | ORAL_TABLET | ORAL | Status: DC
  Administered 2017-04-30: 2 via ORAL
  Filled 2017-04-29: qty 2

## 2017-04-29 MED ORDER — LIDOCAINE HCL (PF) 1 % IJ SOLN
INTRAMUSCULAR | Status: DC | PRN
  Administered 2017-04-29: 3 mL

## 2017-04-29 MED ORDER — MISOPROSTOL 200 MCG PO TABS
ORAL_TABLET | ORAL | Status: AC
  Filled 2017-04-29: qty 4

## 2017-04-29 MED ORDER — BUPIVACAINE HCL (PF) 0.25 % IJ SOLN
INTRAMUSCULAR | Status: DC | PRN
  Administered 2017-04-29: 1 mL via INTRATHECAL

## 2017-04-29 MED ORDER — SERTRALINE HCL 100 MG PO TABS
100.0000 mg | ORAL_TABLET | Freq: Every day | ORAL | Status: DC
  Administered 2017-04-30 – 2017-05-01 (×2): 100 mg via ORAL
  Filled 2017-04-29 (×4): qty 1

## 2017-04-29 MED ORDER — AMMONIA AROMATIC IN INHA
RESPIRATORY_TRACT | Status: AC
  Filled 2017-04-29: qty 10

## 2017-04-29 MED ORDER — PHENYLEPHRINE 40 MCG/ML (10ML) SYRINGE FOR IV PUSH (FOR BLOOD PRESSURE SUPPORT)
80.0000 ug | PREFILLED_SYRINGE | INTRAVENOUS | Status: DC | PRN
  Filled 2017-04-29: qty 5

## 2017-04-29 MED ORDER — FENTANYL CITRATE (PF) 100 MCG/2ML IJ SOLN: 50.0000 ug | INTRAMUSCULAR | Status: DC | PRN

## 2017-04-29 MED ORDER — ONDANSETRON HCL 4 MG/2ML IJ SOLN
4.0000 mg | INTRAMUSCULAR | Status: DC | PRN
  Filled 2017-04-29: qty 2

## 2017-04-29 MED ORDER — LEVOTHYROXINE SODIUM 100 MCG PO TABS
100.0000 ug | ORAL_TABLET | Freq: Every day | ORAL | Status: DC
  Administered 2017-04-30 – 2017-05-01 (×2): 100 ug via ORAL
  Filled 2017-04-29 (×2): qty 1

## 2017-04-29 MED ORDER — LIDOCAINE HCL (PF) 1 % IJ SOLN: 30.0000 mL | INTRAMUSCULAR | Status: DC | PRN

## 2017-04-29 MED ORDER — DIPHENHYDRAMINE HCL 50 MG/ML IJ SOLN: 12.5000 mg | INTRAMUSCULAR | Status: DC | PRN

## 2017-04-29 MED ORDER — EPHEDRINE 5 MG/ML INJ
10.0000 mg | INTRAVENOUS | Status: DC | PRN
  Filled 2017-04-29: qty 2

## 2017-04-29 MED ORDER — SIMETHICONE 80 MG PO CHEW: 80.0000 mg | CHEWABLE_TABLET | ORAL | Status: DC | PRN

## 2017-04-29 MED ORDER — OXYCODONE HCL 5 MG PO TABS
5.0000 mg | ORAL_TABLET | ORAL | Status: DC | PRN
  Administered 2017-04-29 – 2017-05-01 (×5): 5 mg via ORAL
  Filled 2017-04-29 (×5): qty 1

## 2017-04-29 MED ORDER — LACTATED RINGERS IV SOLN: 500.0000 mL | INTRAVENOUS | Status: DC | PRN

## 2017-04-29 MED ORDER — LACTATED RINGERS IV SOLN
INTRAVENOUS | Status: DC
  Administered 2017-04-29: 10:00:00 via INTRAVENOUS

## 2017-04-29 MED ORDER — LIDOCAINE HCL (PF) 1 % IJ SOLN
INTRAMUSCULAR | Status: AC
  Filled 2017-04-29: qty 30

## 2017-04-29 MED ORDER — IBUPROFEN 600 MG PO TABS
600.0000 mg | ORAL_TABLET | Freq: Four times a day (QID) | ORAL | Status: DC
  Administered 2017-04-29 – 2017-04-30 (×3): 600 mg via ORAL
  Filled 2017-04-29 (×3): qty 1

## 2017-04-29 MED ORDER — ONDANSETRON HCL 4 MG PO TABS: 4.0000 mg | ORAL_TABLET | ORAL | Status: DC | PRN

## 2017-04-29 MED ORDER — ONDANSETRON HCL 4 MG/2ML IJ SOLN: 4.0000 mg | Freq: Four times a day (QID) | INTRAMUSCULAR | Status: DC | PRN

## 2017-04-29 MED ORDER — DIPHENHYDRAMINE HCL 25 MG PO CAPS: 25.0000 mg | ORAL_CAPSULE | Freq: Four times a day (QID) | ORAL | Status: DC | PRN

## 2017-04-29 MED ORDER — CYCLOBENZAPRINE HCL 5 MG PO TABS
5.0000 mg | ORAL_TABLET | Freq: Three times a day (TID) | ORAL | Status: DC | PRN
  Administered 2017-04-29: 5 mg via ORAL
  Filled 2017-04-29 (×3): qty 1

## 2017-04-29 MED ORDER — LIOTHYRONINE SODIUM 5 MCG PO TABS
5.0000 ug | ORAL_TABLET | Freq: Every day | ORAL | Status: DC
  Administered 2017-04-30 – 2017-05-01 (×2): 5 ug via ORAL
  Filled 2017-04-29 (×2): qty 1

## 2017-04-29 MED ORDER — WITCH HAZEL-GLYCERIN EX PADS: 1.0000 "application " | MEDICATED_PAD | CUTANEOUS | Status: DC | PRN

## 2017-04-29 MED ORDER — IBUPROFEN 600 MG PO TABS
600.0000 mg | ORAL_TABLET | Freq: Four times a day (QID) | ORAL | Status: DC
  Administered 2017-04-29: 600 mg via ORAL
  Filled 2017-04-29: qty 1

## 2017-04-29 MED ORDER — OXYTOCIN BOLUS FROM INFUSION
500.0000 mL | Freq: Once | INTRAVENOUS | Status: AC
  Administered 2017-04-29: 500 mL via INTRAVENOUS

## 2017-04-29 MED ORDER — FENTANYL 2.5 MCG/ML W/ROPIVACAINE 0.15% IN NS 100 ML EPIDURAL (ARMC)
12.0000 mL/h | EPIDURAL | Status: DC
  Administered 2017-04-29: 12 mL/h via EPIDURAL
  Filled 2017-04-29: qty 100

## 2017-04-29 MED ORDER — ACETAMINOPHEN 325 MG PO TABS: 650.0000 mg | ORAL_TABLET | ORAL | Status: DC | PRN

## 2017-04-29 MED ORDER — OXYTOCIN 10 UNIT/ML IJ SOLN
INTRAMUSCULAR | Status: AC
  Filled 2017-04-29: qty 2

## 2017-04-29 MED ORDER — COCONUT OIL OIL
1.0000 "application " | TOPICAL_OIL | Status: DC | PRN
  Administered 2017-04-29: 1 via TOPICAL
  Filled 2017-04-29: qty 120

## 2017-04-29 MED ORDER — SOD CITRATE-CITRIC ACID 500-334 MG/5ML PO SOLN: 30.0000 mL | ORAL | Status: DC | PRN

## 2017-04-29 MED ORDER — DIBUCAINE 1 % RE OINT: 1.0000 "application " | TOPICAL_OINTMENT | RECTAL | Status: DC | PRN

## 2017-04-29 MED ORDER — LACTATED RINGERS IV SOLN
500.0000 mL | Freq: Once | INTRAVENOUS | Status: AC
  Administered 2017-04-29: 500 mL via INTRAVENOUS

## 2017-04-29 MED ORDER — SODIUM CHLORIDE 0.9 % IV SOLN
2.0000 g | Freq: Once | INTRAVENOUS | Status: AC
  Administered 2017-04-29: 2 g via INTRAVENOUS
  Filled 2017-04-29: qty 2000

## 2017-04-29 MED ORDER — BENZOCAINE-MENTHOL 20-0.5 % EX AERO
1.0000 "application " | INHALATION_SPRAY | CUTANEOUS | Status: DC | PRN
  Administered 2017-04-29: 1 via TOPICAL
  Filled 2017-04-29: qty 56

## 2017-04-29 NOTE — Anesthesia Preprocedure Evaluation (Signed)
Anesthesia Evaluation  Patient identified by MRN, date of birth, ID band Patient awake    Reviewed: Allergy & Precautions, NPO status , Patient's Chart, lab work & pertinent test results  History of Anesthesia Complications Negative for: history of anesthetic complications  Airway Mallampati: II  TM Distance: >3 FB Neck ROM: Full    Dental no notable dental hx.    Pulmonary neg pulmonary ROS, neg sleep apnea, neg COPD,    breath sounds clear to auscultation- rhonchi (-) wheezing      Cardiovascular Exercise Tolerance: Good (-) hypertension(-) CAD and (-) Past MI  Rhythm:Regular Rate:Normal - Systolic murmurs and - Diastolic murmurs    Neuro/Psych negative neurological ROS  negative psych ROS   GI/Hepatic negative GI ROS, Neg liver ROS,   Endo/Other  negative endocrine ROSneg diabetes  Renal/GU negative Renal ROS     Musculoskeletal negative musculoskeletal ROS (+)   Abdominal (+) - obese, Gravid abdomen  Peds  Hematology negative hematology ROS (+)   Anesthesia Other Findings   Reproductive/Obstetrics (+) Pregnancy                             Lab Results  Component Value Date   WBC 13.6 (H) 04/29/2017   HGB 10.6 (L) 04/29/2017   HCT 33.1 (L) 04/29/2017   MCV 81.2 04/29/2017   PLT 160 04/29/2017    Anesthesia Physical Anesthesia Plan  ASA: II  Anesthesia Plan: Epidural   Post-op Pain Management:    Induction:   PONV Risk Score and Plan: 2  Airway Management Planned:   Additional Equipment:   Intra-op Plan:   Post-operative Plan:   Informed Consent: I have reviewed the patients History and Physical, chart, labs and discussed the procedure including the risks, benefits and alternatives for the proposed anesthesia with the patient or authorized representative who has indicated his/her understanding and acceptance.     Plan Discussed with: CRNA and  Anesthesiologist  Anesthesia Plan Comments: (Plan for epidural for labor, discussed epidural vs spinal vs GA if need for csection)        Anesthesia Quick Evaluation

## 2017-04-29 NOTE — Progress Notes (Signed)
Pt up to wheelchair to room 346.

## 2017-04-29 NOTE — Discharge Summary (Signed)
Obstetric Discharge Summary   Patient ID: Patient Name: Bianca Brennan DOB: [birth year] MRN: [id number]  Date of Admission: 04/29/2017 Date of Delivery: 04/29/17 Delivered by: Sheppard Penton CNM Date of Discharge: 05/01/2017  Primary OB: [facility]  IWL:[id number] last menstrual period was 03/10/2016. EDC Estimated Date of Delivery: 05/04/17 Gestational Age at Delivery: [redacted]w[redacted]d   Antepartum complications:  1. AMA 2. Chronic LBP 3. GBS Pos 4. Hashimoto's- well controlled on stable levothyroxine dose, last growth on 1/17- EFW 3002g at 44%   Admitting Diagnosis: active labor, GBS Pos  Secondary Diagnoses: Patient Active Problem List   Diagnosis Date Noted  . Labor and delivery indication for care or intervention 04/29/2017  . Advanced maternal age in multigravida, second trimester     Augmentation: None Complications: None Intrapartum complications/course:  Delivery Type: spontaneous vaginal delivery Anesthesia: epidural Placenta: spontaneous Laceration: left periurethral lac with repair Episiotomy: none  Newborn Data: Live born female  Birth Weight:   APGAR: 4, 57  Newborn Delivery   Birth date/time:  04/29/2017 12:06:00 Delivery type:  Vaginal, Spontaneous         Postpartum Course  Patient had an uncomplicated postpartum course.  By time of discharge on PPD#2 her pain was controlled on oral pain medications; she had appropriate lochia and was ambulating, voiding without difficulty and tolerating regular diet.  She was deemed stable for discharge to home.       Labs: CBC Latest Ref Rng & Units 04/30/2017 04/29/2017 03/20/2014  WBC 3.6 - 11.0 K/uL 10.5 13.6(H) 5.5  Hemoglobin 12.0 - 16.0 g/dL 10.0(L) 10.6(L) 14.1  Hematocrit 35.0 - 47.0 % 30.5(L) 33.1(L) 41.6  Platelets 150 - 440 K/uL 136(L) 160 173   B POS  Physical exam:  BP 117/63 (BP Location: Left Arm)   Pulse 69   Temp 98 F (36.7 C) (Oral)   Resp 18   Ht 5\' 4"  (1.626 m)   Wt 69.4 kg (153 lb)   LMP  03/10/2016   SpO2 100%   BMI 26.26 kg/m  General: alert and no distress Pulm: normal respiratory effort Lochia: appropriate Abdomen: soft, NT Uterine Fundus: firm, below umbilicus Extremities: No evidence of DVT seen on physical exam. No lower extremity edema.   Disposition: stable, discharge to home Baby Feeding: breastmilk Baby Disposition: home with mom   Prenatal Labs:  Blood type/Rh B Pos  Antibody screen neg  Rubella Immune  Varicella Immune  RPR NR  HBsAg Neg  HIV NR  GC neg  Chlamydia neg  Genetic screening negative  1 hour GTT 92  3 hour GTT   GBS Pos   Rh Immune globulin given: n/a Tdap given AP: 02/08/17 Flu given AP: 01/03/17  Plan:  Bianca Brennan was discharged to home in good condition. Follow-up appointment at [facility] with delivery provider in 4-6 weeks  Discharge Instructions: Per After Visit Summary. Activity: Advance as tolerated. Pelvic rest for 6 weeks.   Diet: Regular Discharge Medications: Allergies as of 05/01/2017   No Known Allergies     Medication List    TAKE these medications   cholecalciferol 1000 units tablet Commonly known as:  VITAMIN D Take 1,000 Units by mouth daily.   cyclobenzaprine 5 MG tablet Commonly known as:  FLEXERIL Take 5 mg by mouth 3 (three) times daily as needed for muscle spasms.   docusate sodium 100 MG capsule Commonly known as:  COLACE Take 1 capsule (100 mg total) by mouth 2 (two) times daily for 14 days. To  keep stools soft, as needed   ferrous sulfate 325 (65 FE) MG tablet Take 1 tablet (325 mg total) by mouth daily with breakfast. Take with Vitamin C   ibuprofen 800 MG tablet Commonly known as:  ADVIL,MOTRIN Take 1 tablet (800 mg total) by mouth every 8 (eight) hours as needed for moderate pain or cramping.   levothyroxine 100 MCG tablet Commonly known as:  SYNTHROID, LEVOTHROID Take 100 mcg by mouth daily before breakfast.   liothyronine 5 MCG tablet Commonly known as:   CYTOMEL Take 5 mcg by mouth daily.   sertraline 100 MG tablet Commonly known as:  ZOLOFT Take 100 mg by mouth daily.   tizanidine 2 MG capsule Commonly known as:  ZANAFLEX Take 2 mg by mouth 3 (three) times daily.      Outpatient follow up:    Signed:  Benjaman Kindler, MD 05/01/17\  1:48 PM

## 2017-04-29 NOTE — Anesthesia Procedure Notes (Signed)
Epidural Patient location during procedure: OB Start time: 04/29/2017 10:00 AM End time: 04/29/2017 10:17 AM  Staffing Anesthesiologist: Emmie Niemann, MD Performed: anesthesiologist   Preanesthetic Checklist Completed: patient identified, site marked, surgical consent, pre-op evaluation, timeout performed, IV checked, risks and benefits discussed and monitors and equipment checked  Epidural Patient position: sitting Prep: ChloraPrep Patient monitoring: heart rate, continuous pulse ox and blood pressure Approach: midline Location: L4-L5 Injection technique: LOR saline  Needle:  Needle type: Tuohy  Needle gauge: 18 G Needle length: 9 cm and 9 Needle insertion depth: 5.5 cm Catheter type: closed end flexible Catheter size: 20 Guage Catheter at skin depth: 9.5 cm Test dose: negative  Assessment Events: blood not aspirated, injection not painful, no injection resistance, negative IV test and no paresthesia  Additional Notes CSE performed with needle through needle technique using 27g pencil tip spinal needle. 70mL 0.25% bupivacaine given for spinal dose.   Patient tolerated the insertion well without complications.Reason for block:procedure for pain

## 2017-04-29 NOTE — H&P (Signed)
OB History & Physical   History of Present Illness:  Chief Complaint: painful UCs since 0630  HPI:  Bianca Brennan is a 37 y.o. [id number] female at [redacted]w[redacted]d dated by [place] at 7wks [place].  She presents to L&D for Labor.   Active FM onset of ctx @ 0630 currently every 3-5 minutes bloody show: small amt normal show.     Pregnancy Issues: 1. AMA 2. Chronic LBP 3. GBS Pos 4. Hashimoto's- last growth on 1/17- EFW 3002g at 44%   Maternal Medical History:   Past Medical History:  Diagnosis Date  . Anxiety    related to CLBP  . Depression    related to CLBP  . Hashimoto's disease     Past Surgical History:  Procedure Laterality Date  . BREAST SURGERY    . DILATION AND CURETTAGE OF UTERUS    . 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 Authorizing Provider  cholecalciferol (VITAMIN D) 1000 UNITS tablet Take 1,000 Units by mouth daily.    [provider]  cyclobenzaprine (FLEXERIL) 5 MG tablet Take 5 mg by mouth 3 (three) times daily as needed for muscle spasms.    [provider]  escitalopram (LEXAPRO) 5 MG tablet Take 5 mg by mouth daily.    [provider]  levothyroxine (SYNTHROID, LEVOTHROID) 100 MCG tablet Take 100 mcg by mouth daily before breakfast.    [provider]  liothyronine (CYTOMEL) 5 MCG tablet Take 5 mcg by mouth daily.    [provider]  tizanidine (ZANAFLEX) 2 MG capsule Take 2 mg by mouth 3 (three) times daily.    [provider]     Prenatal care site: [place] History: She  reports that  has never smoked. she has never used smokeless tobacco. She reports that she does not drink alcohol or use drugs.  Family History: family history includes Diabetes in her father and mother; Heart disease in her maternal grandfather and paternal grandfather; Hyperlipidemia in her father and mother.    Review of Systems: A full review of systems was performed and negative except as noted in the HPI.     Physical Exam:  Vital Signs: BP 115/65   Pulse (!) 102   Temp 98.3 F (36.8 C) (Oral)   LMP 03/10/2016  General: no acute distress.  HEENT: normocephalic, atraumatic Heart: regular rate & rhythm.  No murmurs/rubs/gallops Lungs: clear to auscultation bilaterally, normal respiratory effort Abdomen: soft, gravid, non-tender;  EFW: 7lbs Pelvic:   External: Normal external female genitalia  Cervix: Dilation: 5 / Effacement (%): 90 / Station: -2    Extremities: non-tender, symmetric, no edema bilaterally.  DTRs: 2+  Neurologic: Alert & oriented x 3.    No results found for this or any previous visit (from the past 24 hour(s)).  Pertinent Results:  Prenatal Labs: Blood type/Rh B Pos  Antibody screen neg  Rubella Immune  Varicella Immune  RPR NR  HBsAg Neg  HIV NR  GC neg  Chlamydia neg  Genetic screening negative  1 hour GTT 92  3 hour GTT   GBS Pos   FHT: Cat I tracing, 145bpm, mod variability, + accels, no decels.  TOCO: q3-69min SVE:  Dilation: 5 / Effacement (%): 90 / Station: -2    Cephalic by leopolds  No results found.  Assessment:  Bianca  Brennan is a 37 y.o. G89P0 female at [redacted]w[redacted]d with active labor and GBS Pos.    Plan:  1. Admit to Labor & Delivery; consents reviewed and obtained  2. Fetal Well being  - Fetal Tracing: Cat I tracing - Group B Streptococcus ppx indicated: yes, Ampicillin  - Presentation: cephalic confirmed by exam   3. Routine OB: - Prenatal labs reviewed, as above - Rh B Pos - CBC, T&S, RPR on admit - Clear fluids, IVF  4. Monitoring of Labor -  Contractions: external toco in place -  Pelvis proven to 6#7 -  Plan for continuous fetal monitoring  -  Maternal pain control as desired; requesting regional anesthesia - Anticipate vaginal delivery  5. Post Partum Planning: - Infant feeding: breast - Contraception: undecided -  Tdap and Flu given AP  Jeremey Bascom, Armstrong A, CNM 04/29/17 9:10 AM

## 2017-04-30 LAB — CBC
HEMATOCRIT: 30.5 % — AB (ref 35.0–47.0)
Hemoglobin: 10 g/dL — ABNORMAL LOW (ref 12.0–16.0)
MCH: 26.3 pg (ref 26.0–34.0)
MCHC: 32.8 g/dL (ref 32.0–36.0)
MCV: 80.3 fL (ref 80.0–100.0)
Platelets: 136 10*3/uL — ABNORMAL LOW (ref 150–440)
RBC: 3.8 MIL/uL (ref 3.80–5.20)
RDW: 14.7 % — AB (ref 11.5–14.5)
WBC: 10.5 10*3/uL (ref 3.6–11.0)

## 2017-04-30 LAB — RPR: RPR Ser Ql: NONREACTIVE

## 2017-04-30 MED ORDER — IBUPROFEN 600 MG PO TABS
600.0000 mg | ORAL_TABLET | Freq: Four times a day (QID) | ORAL | Status: DC
  Administered 2017-04-30 – 2017-05-01 (×3): 600 mg via ORAL
  Filled 2017-04-30 (×3): qty 1

## 2017-04-30 MED ORDER — FERROUS SULFATE 325 (65 FE) MG PO TABS
325.0000 mg | ORAL_TABLET | Freq: Two times a day (BID) | ORAL | Status: DC
  Administered 2017-04-30 – 2017-05-01 (×3): 325 mg via ORAL
  Filled 2017-04-30 (×3): qty 1

## 2017-04-30 MED ORDER — VITAMIN C 500 MG PO TABS
250.0000 mg | ORAL_TABLET | Freq: Two times a day (BID) | ORAL | Status: DC
  Administered 2017-04-30 – 2017-05-01 (×3): 250 mg via ORAL
  Filled 2017-04-30 (×3): qty 1

## 2017-04-30 NOTE — Progress Notes (Signed)
Post Partum Day 1 Subjective:  Tolerating regular diet, pain with PO meds, voiding and ambulating without difficulty. Generalized soreness from pushing/labor, overall feels well.  Infant had some difficulty breastfeeding overnight, very fussy until given donor milk.  No CP SOB Fever,Chills, N/V or leg pain; denies nipple or breast pain no HA change of vision, RUQ/epigastric pain  Objective: BP 116/71 (BP Location: Left Arm)   Pulse 80   Temp 97.9 F (36.6 C) (Oral)   Resp 18   Ht 5\' 4"  (1.626 m)   Wt 153 lb (69.4 kg)   LMP 03/10/2016   SpO2 100%   BMI 26.26 kg/m    Physical Exam:  General: NAD Breasts: soft/nontender CV: RRR Pulm: nl effort, CTABL Abdomen: soft, NT, BS x 4 Perineum: minimal edema, intact/lacerations hemostatic/repair well approximated Lochia: small, no clots Uterine Fundus: fundus firm and 2 fb below umbilicus DVT Evaluation: no cords, ttp LEs   Recent Labs    04/29/17 0915 04/30/17 0424  HGB 10.6* 10.0*  HCT 33.1* 30.5*  WBC 13.6* 10.5  PLT 160 136*    Assessment/Plan: 37 y.o. [id number] postpartum day # 1  - Continue routine PP care - Lactation consult prn  - home meds resumed today.  - Mild anemia - hemodynamically stable and asymptomatic; start po ferrous sulfate BID with stool softeners and Vit C - Immunization status:  all Imms up to date    Disposition: remain inpatient on PP    Thai Hemrick A, CNM 04/30/17 8:51 AM

## 2017-04-30 NOTE — Anesthesia Postprocedure Evaluation (Signed)
Anesthesia Post Note  Patient: Bianca Brennan  Procedure(s) Performed: AN AD HOC LABOR EPIDURAL  Patient location during evaluation: Mother Baby Anesthesia Type: Epidural Level of consciousness: awake and alert Pain management: pain level controlled Vital Signs Assessment: post-procedure vital signs reviewed and stable Respiratory status: spontaneous breathing, nonlabored ventilation and respiratory function stable Cardiovascular status: stable Postop Assessment: no headache, no backache and epidural receding Anesthetic complications: no     Last Vitals:  Vitals:   04/30/17 0603 04/30/17 0928  BP: 116/71 110/70  Pulse: 80 88  Resp: 18 17  Temp: 36.6 C 37.2 C  SpO2: 100% 99%    Last Pain:  Vitals:   04/30/17 1632  TempSrc:   PainSc: 0-No pain                 Martha Clan

## 2017-05-01 ENCOUNTER — Other Ambulatory Visit: Payer: Self-pay | Admitting: Obstetrics and Gynecology

## 2017-05-01 MED ORDER — FERROUS SULFATE 325 (65 FE) MG PO TABS: 325.0000 mg | ORAL_TABLET | Freq: Every day | ORAL | 1 refills | Status: AC

## 2017-05-01 MED ORDER — IBUPROFEN 800 MG PO TABS: 800.0000 mg | ORAL_TABLET | Freq: Three times a day (TID) | ORAL | 1 refills | Status: DC | PRN

## 2017-05-01 MED ORDER — DOCUSATE SODIUM 100 MG PO CAPS: 100.0000 mg | ORAL_CAPSULE | Freq: Two times a day (BID) | ORAL | 0 refills | Status: AC

## 2017-05-01 NOTE — Progress Notes (Signed)
Patient discharged home with infant. Discharge instructions, prescriptions and follow up appointment given to and reviewed with patient. Patient verbalized understanding. Patient wheeled out with infant by auxiliary.  

## 2017-05-12 ENCOUNTER — Ambulatory Visit: Payer: Self-pay

## 2017-05-12 NOTE — Lactation Note (Signed)
This note was copied from a baby's chart. Lactation Consultation Note  Patient Name: Bianca Brennan [id number] Date: 05/12/2017     Maternal Data  Mom has Hashimoto's but isn't currently taking any meds. Mother states that baby is still hungry after long nursing sessions. She also indicated that the [facility] at the [facility] office suggested that she was "comfort feeding the baby" and that the baby could end up obese. This is mom's 3rd baby...1st baby was dx FTT at 68mos and mom had PPD with 2nd baby and chose to just formula feed.   Feeding  Baby's pre wt was: 3164 and after nursing w/o audible swallows for 5 mins. LC stopped feeding to preserve energy of baby and do post wt, which was: 3162.   LATCH Score  Baby was able to latch on using a nipple shield of 50mm, but wasn't able to produce audible sounds.                  Interventions  Nipple shields, bottle, DEBP  Lactation Tools Discussed/Used  SNS, DEBP mom is renting a Medela from [facility], but LC has suggested Spectra 2 for long-term usage.    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 LC referred family to Pediatric Dentist, Dr. Marcelo Baldy. Parents are going to possibly do consult with him and make decision on frenectomy later. Parents also stated that both older children are in speech therapy and that first baby was not "a good nurser". This could correlate directly to [name]'s possible tongue tie since it is typically hereditary. Parents will f/u with LC in order to get further assistance.     Marnee Spring 05/12/2017, 3:12 PM

## 2017-05-19 ENCOUNTER — Ambulatory Visit: Payer: Self-pay

## 2017-05-19 NOTE — Lactation Note (Signed)
This note was copied from a baby's chart. Lactation Consultation Note  Patient Name: Bianca Brennan [id number] Date: 05/19/2017     Maternal Data  Mom has been pumping and FF to keep baby satisfied before TT revision was done. Mom also had surgery on rt breast to have a duct removed that was leaking clear fluid, but after trauma by mammogram machine, blood. Mother also has a hx of Hashimotos. Getting her thyroid levels assessed soon.   Feeding  Baby was able to nurse on mom's rt (lower producing side) for 36mins with some good swallows and vigorous sucks. Infant was able to take 4 mLs. LC suggested removal of the nipple shield and having the baby nurse on mom's breast not affected by any surgeries and the baby was able to pull out mom's inverted nipple with good nursing techniques, but she tired out after 63mins. Baby took in 63mL  LATCH Score  6 Latch score                 Interventions  nipple shield  Lactation Tools Discussed/Used   DEBP, nipple shield (when needed, both inverted nipples come out)  Consult Status  Mom has been told to continue with the post lip and TT revision exercises and nursing on demand. When baby doesn't feed well, mom is to follow up with expressed milk. Encouraged mom to keep nursing w/o use of shield now that TT and lip are better and her nipples come out with stimulation. Mom's supply has increased since last week's appointment and mom states that she feels her breasts are emptier after nursing sessions. LC would like mom to come to mom's express meeting next week for another pre/post wt check to ensure milk transfer is sufficient. Currently, the transfer is better, but not adequate to meet infant's needs.    Marnee Spring 05/19/2017, 4:41 PM

## 2017-05-25 ENCOUNTER — Telehealth: Payer: Self-pay

## 2017-05-25 ENCOUNTER — Telehealth: Payer: Self-pay | Admitting: Lactation Services

## 2017-06-15 DIAGNOSIS — E039 Hypothyroidism, unspecified: Secondary | ICD-10-CM | POA: Insufficient documentation

## 2017-07-08 ENCOUNTER — Telehealth: Payer: Self-pay | Admitting: Lactation Services

## 2017-09-01 ENCOUNTER — Other Ambulatory Visit: Payer: Self-pay | Admitting: Infectious Diseases

## 2017-09-01 ENCOUNTER — Ambulatory Visit
Admission: RE | Admit: 2017-09-01 | Discharge: 2017-09-01 | Disposition: A | Discharge status: Home / Self Care | Payer: 59 | Source: Ambulatory Visit | Attending: Infectious Diseases | Admitting: Infectious Diseases

## 2017-09-01 DIAGNOSIS — R509 Fever, unspecified: Secondary | ICD-10-CM

## 2017-09-01 DIAGNOSIS — R918 Other nonspecific abnormal finding of lung field: Secondary | ICD-10-CM | POA: Diagnosis not present

## 2017-09-01 DIAGNOSIS — I493 Ventricular premature depolarization: Secondary | ICD-10-CM | POA: Insufficient documentation

## 2017-09-01 MED ORDER — IOPAMIDOL (ISOVUE-300) INJECTION 61%
100.0000 mL | Freq: Once | INTRAVENOUS | Status: AC | PRN
  Administered 2017-09-01: 100 mL via INTRAVENOUS

## 2017-09-27 ENCOUNTER — Encounter: Payer: Self-pay | Admitting: Internal Medicine

## 2017-09-27 ENCOUNTER — Ambulatory Visit (INDEPENDENT_AMBULATORY_CARE_PROVIDER_SITE_OTHER): Payer: 59 | Admitting: Internal Medicine

## 2017-09-27 VITALS — BP 110/75 | HR 83 | Temp 98.9°F | Ht 64.0 in | Wt 138.0 lb

## 2017-09-27 DIAGNOSIS — Z889 Allergy status to unspecified drugs, medicaments and biological substances status: Secondary | ICD-10-CM

## 2017-09-27 DIAGNOSIS — I809 Phlebitis and thrombophlebitis of unspecified site: Secondary | ICD-10-CM

## 2017-09-27 DIAGNOSIS — R509 Fever, unspecified: Secondary | ICD-10-CM | POA: Diagnosis not present

## 2017-09-27 NOTE — Progress Notes (Signed)
RFV: FUO Patient ID: Bianca Brennan, female   DOB: 15-Oct-1980, 37 y.o.   MRN: [id number]  HPI Started to have fevers roughly 3 weeks post partum of unclear source. Had 2 courses of dicloxacillin which the later developed diffuse maculapapular rash requiring steroids. Then switched to courses of FQ, once she completed abtx then roughly a 7-10 days thereafter would have the recurrence of abrupt fevers, mostly at night, feeling poorly. She has had follow up with Dr Ola Spurr, her OB, and also followed closely by her PCP, Dr Elta Guadeloupe Sabra Heck. 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 She has had blood cx ngtd. Her blood work suggests bacterial infection due to left shift and also inflammatory markers are elevated.  She is back to normal, but anxious that she may have recurrence.   Postpartum depression only complication that she reports.  I have reviewed her outside records  Outpatient Encounter Medications as of 09/27/2017  Medication Sig  . cyclobenzaprine (FLEXERIL) 5 MG tablet Take 5 mg by mouth 3 (three) times daily as needed for muscle spasms.  Marland Kitchen ibuprofen (ADVIL,MOTRIN) 800 MG tablet Take 1 tablet (800 mg total) by mouth every 8 (eight) hours as needed for moderate pain or cramping.  Marland Kitchen levothyroxine (SYNTHROID, LEVOTHROID) 100 MCG tablet Take 100 mcg by mouth daily before breakfast.  . liothyronine (CYTOMEL) 5 MCG tablet Take 5 mcg by mouth daily.  . sertraline (ZOLOFT) 100 MG tablet Take 100 mg by mouth daily.  . tizanidine (ZANAFLEX) 2 MG capsule Take 2 mg by mouth 3 (three) times daily.  . cholecalciferol (VITAMIN D) 1000 UNITS tablet Take 1,000 Units by mouth daily.  . ferrous sulfate 325 (65 FE) MG tablet Take 1 tablet (325 mg total) by mouth daily with breakfast. Take with Vitamin C   No facility-administered encounter  medications on file as of 09/27/2017.      Patient Active Problem List   Diagnosis Date Noted  . Labor and delivery indication for care or intervention 04/29/2017  . Advanced maternal age in multigravida, second trimester      Health Maintenance Due  Topic Date Due  . HIV Screening  12/10/1994  . TETANUS/TDAP  12/10/1998  . PAP SMEAR  12/09/2000    Social History   Tobacco Use  . Smoking status: Never Smoker  . Smokeless tobacco: Never Used  Substance Use Topics  . Alcohol use: No  . Drug use: No  family history includes Diabetes in her father and mother; Heart disease in her maternal grandfather and paternal grandfather; Hyperlipidemia in her father and mother. Review of Systems  Constitutional: Negative for fever, chills, diaphoresis, activity change, appetite change, fatigue and unexpected weight change.  HENT: Negative for congestion, sore throat, rhinorrhea, sneezing, trouble swallowing and sinus pressure.  Eyes: Negative for photophobia and visual disturbance.  Respiratory: Negative for cough, chest tightness, shortness of breath, wheezing and stridor.  Cardiovascular: Negative for chest pain, palpitations and leg swelling.  Gastrointestinal: Negative for nausea, vomiting, abdominal pain, diarrhea, constipation, blood in stool, abdominal distention and anal bleeding.  Genitourinary: Negative for dysuria, hematuria, flank pain and difficulty urinating.  Musculoskeletal: Negative for myalgias, back pain, joint swelling, arthralgias and gait problem.  Skin: Negative for color change, pallor, rash and wound.  Neurological: Negative for dizziness, tremors, weakness and light-headedness.  Hematological: Negative for adenopathy. Does not bruise/bleed easily.  Psychiatric/Behavioral: Negative for  behavioral problems, confusion, sleep disturbance, dysphoric mood, decreased concentration and agitation.    Physical Exam   BP 110/75   Pulse 83   Temp 98.9 F (37.2 C) (Oral)    Ht 5\' 4"  (1.626 m)   Wt 138 lb (62.6 kg)   LMP 09/07/2017 (Exact Date)   BMI 23.69 kg/m     Constitutional:  oriented to person, place, and time. appears well-developed and well-nourished. No distress.  HENT: [place]/AT, PERRLA, no scleral icterus Mouth/Throat: Oropharynx is clear and moist. No oropharyngeal exudate.  Cardiovascular: Normal rate, regular rhythm and normal heart sounds. Exam reveals no gallop and no friction rub.  No murmur heard.  Pulmonary/Chest: Effort normal and breath sounds normal. No respiratory distress.  has no wheezes.  Neck = supple, no nuchal rigidity Abdominal: Soft. Bowel sounds are normal.  exhibits no distension. There is no tenderness.  Lymphadenopathy: no cervical adenopathy. No axillary adenopathy Neurological: alert and oriented to person, place, and time.  Skin: Skin is warm and dry. No rash noted. No erythema.  Psychiatric: a normal mood and affect.  behavior is normal.    Lab Results  Component Value Date   LABRPR Non Reactive 04/29/2017    CBC Lab Results  Component Value Date   WBC 10.5 04/30/2017   RBC 3.80 04/30/2017   HGB 10.0 (L) 04/30/2017   HCT 30.5 (L) 04/30/2017   PLT 136 (L) 04/30/2017   MCV 80.3 04/30/2017   MCH 26.3 04/30/2017   MCHC 32.8 04/30/2017   RDW 14.7 (H) 04/30/2017   LYMPHSABS 1.2 03/20/2014   MONOABS 0.3 03/20/2014   EOSABS 0.0 03/20/2014    BMET Lab Results  Component Value Date   NA 140 03/20/2014   K 4.0 03/20/2014   CL 104 03/20/2014   CO2 28 03/20/2014   GLUCOSE 108 (H) 03/20/2014   BUN 10 03/20/2014   CREATININE 0.59 (L) 03/20/2014   CALCIUM 8.4 (L) 03/20/2014   GFRNONAA >60 03/20/2014   GFRAA >60 03/20/2014      Assessment and Plan FUO = Has been off of abtx for roughly 10 days. Has had recurrence at the 14 day mark in the past.  She has finished course for septic thrombophlebitis.  - will do lab work today (rule out cmv and ebv and check that she has normalized since previous  leukocytosis) - ask her to check fever curve  Drug allergy - has had diffuse rash to dicloxacillin requiring steroids as well as ceftriaxone. - would -see her back in 2-4 wk

## 2017-09-27 NOTE — Patient Instructions (Signed)
If you have fever or feeling poorly, please contact me at [phone] - Dr Baxter Flattery  Can also call ID doc on call through [place]

## 2017-09-28 ENCOUNTER — Telehealth: Payer: Self-pay | Admitting: Behavioral Health

## 2017-09-28 LAB — CBC WITH DIFFERENTIAL/PLATELET
BASOS ABS: 13 {cells}/uL (ref 0–200)
Basophils Relative: 0.2 %
EOS ABS: 38 {cells}/uL (ref 15–500)
Eosinophils Relative: 0.6 %
HCT: 38.9 % (ref 35.0–45.0)
HEMOGLOBIN: 13 g/dL (ref 11.7–15.5)
Lymphs Abs: 1575 cells/uL (ref 850–3900)
MCH: 29.8 pg (ref 27.0–33.0)
MCHC: 33.4 g/dL (ref 32.0–36.0)
MCV: 89.2 fL (ref 80.0–100.0)
MONOS PCT: 8.3 %
MPV: 11.7 fL (ref 7.5–12.5)
NEUTROS ABS: 4152 {cells}/uL (ref 1500–7800)
Neutrophils Relative %: 65.9 %
Platelets: 173 10*3/uL (ref 140–400)
RBC: 4.36 10*6/uL (ref 3.80–5.10)
RDW: 12.4 % (ref 11.0–15.0)
Total Lymphocyte: 25 %
WBC mixed population: 523 cells/uL (ref 200–950)
WBC: 6.3 10*3/uL (ref 3.8–10.8)

## 2017-09-28 LAB — COMPLETE METABOLIC PANEL WITH GFR
AG RATIO: 1.8 (calc) (ref 1.0–2.5)
ALBUMIN MSPROF: 4.2 g/dL (ref 3.6–5.1)
ALKALINE PHOSPHATASE (APISO): 60 U/L (ref 33–115)
ALT: 20 U/L (ref 6–29)
AST: 20 U/L (ref 10–30)
BILIRUBIN TOTAL: 0.6 mg/dL (ref 0.2–1.2)
BUN: 18 mg/dL (ref 7–25)
CO2: 27 mmol/L (ref 20–32)
Calcium: 9.3 mg/dL (ref 8.6–10.2)
Chloride: 105 mmol/L (ref 98–110)
Creat: 0.67 mg/dL (ref 0.50–1.10)
GFR, EST AFRICAN AMERICAN: 130 mL/min/{1.73_m2} (ref >=60)
GFR, Est Non African American: 112 mL/min/{1.73_m2} (ref >=60)
GLOBULIN: 2.3 g/dL (ref 1.9–3.7)
GLUCOSE: 92 mg/dL (ref 65–99)
POTASSIUM: 4 mmol/L (ref 3.5–5.3)
SODIUM: 138 mmol/L (ref 135–146)
Total Protein: 6.5 g/dL (ref 6.1–8.1)

## 2017-09-28 LAB — EPSTEIN-BARR VIRUS VCA ANTIBODY PANEL
EBV NA IgG: >600 U/mL — ABNORMAL HIGH
EBV VCA IgG: 279 U/mL — ABNORMAL HIGH

## 2017-09-28 LAB — SEDIMENTATION RATE: Sed Rate: 2 mm/h (ref 0–20)

## 2017-09-28 LAB — C-REACTIVE PROTEIN: CRP: 1.1 mg/L (ref <8.0)

## 2017-09-28 NOTE — Telephone Encounter (Signed)
Called patient to give results per Dr. Baxter Flattery.  Mrs. Stipp did not answer and her mailbox was full.  Will try again later today. Pricilla Riffle RN

## 2017-09-28 NOTE — Telephone Encounter (Signed)
-----   Message from Carlyle Basques, MD sent at 09/28/2017  1:17 PM EDT ----- Everlena Cooper, Can you call ms. Lindholm and let her know that her blood work looks good. Everything normalized since the last time she had bloodwork in early June. Also let her know that it looks like she probably had mono in the past ( but I don't think that is what happened in the past 4 months)

## 2017-09-29 LAB — CMV ABS, IGG+IGM (CYTOMEGALOVIRUS): CYTOMEGALOVIRUS AB-IGG: 7.4 U/mL — AB

## 2017-10-23 ENCOUNTER — Ambulatory Visit (INDEPENDENT_AMBULATORY_CARE_PROVIDER_SITE_OTHER): Payer: 59 | Admitting: Internal Medicine

## 2017-10-23 ENCOUNTER — Encounter: Payer: Self-pay | Admitting: Internal Medicine

## 2017-10-23 VITALS — BP 101/78 | HR 96 | Temp 98.3°F | Ht 64.0 in | Wt 136.0 lb

## 2017-10-23 DIAGNOSIS — R509 Fever, unspecified: Secondary | ICD-10-CM | POA: Diagnosis not present

## 2017-10-23 NOTE — Progress Notes (Signed)
   RFV: fuo Patient ID: Bianca Brennan, female   DOB: 08/31/1980, 37 y.o.   MRN: [id number]  HPI Bianca Brennan is a 37 yo F who was seen roughly 3-4 wks ago with hx of 5 months of FUO, with extensive work up thought to be due to septic thrombophlebitis, as diagnosis of exclusion and appeared to have resolved after taking abtx and anticoagulation. She has not had any further fever since we last saw her. She is now off of abtx for roughly 5 wk.  Outpatient Encounter Medications as of 10/23/2017  Medication Sig  . cholecalciferol (VITAMIN D) 1000 UNITS tablet Take 1,000 Units by mouth daily.  . cyclobenzaprine (FLEXERIL) 5 MG tablet Take 5 mg by mouth 3 (three) times daily as needed for muscle spasms.  Marland Kitchen levothyroxine (SYNTHROID, LEVOTHROID) 100 MCG tablet Take 100 mcg by mouth daily before breakfast.  . liothyronine (CYTOMEL) 5 MCG tablet Take 5 mcg by mouth daily.  . sertraline (ZOLOFT) 100 MG tablet Take 100 mg by mouth daily.  . tizanidine (ZANAFLEX) 2 MG capsule Take 2 mg by mouth 3 (three) times daily.  . ferrous sulfate 325 (65 FE) MG tablet Take 1 tablet (325 mg total) by mouth daily with breakfast. Take with Vitamin C  . ibuprofen (ADVIL,MOTRIN) 800 MG tablet Take 1 tablet (800 mg total) by mouth every 8 (eight) hours as needed for moderate pain or cramping. (Patient not taking: Reported on 10/23/2017)   No facility-administered encounter medications on file as of 10/23/2017.      Patient Active Problem List   Diagnosis Date Noted  . Labor and delivery indication for care or intervention 04/29/2017  . Advanced maternal age in multigravida, second trimester      Health Maintenance Due  Topic Date Due  . HIV Screening  12/10/1994  . PAP SMEAR  12/09/2000     Review of Systems 12 point ros is negative but noticing small superficial lump on right breast - non tender  Physical Exam   BP 101/78   Pulse 96   Temp 98.3 F (36.8 C) (Oral)   Ht 5\' 4"  (1.626 m)   Wt 136 lb (61.7 kg)   LMP  10/02/2017   BMI 23.34 kg/m    Did not examine Lab Results  Component Value Date   LABRPR Non Reactive 04/29/2017    CBC Lab Results  Component Value Date   WBC 6.3 09/27/2017   RBC 4.36 09/27/2017   HGB 13.0 09/27/2017   HCT 38.9 09/27/2017   PLT 173 09/27/2017   MCV 89.2 09/27/2017   MCH 29.8 09/27/2017   MCHC 33.4 09/27/2017   RDW 12.4 09/27/2017   LYMPHSABS 1,575 09/27/2017   MONOABS 0.3 03/20/2014   EOSABS 38 09/27/2017    BMET Lab Results  Component Value Date   NA 138 09/27/2017   K 4.0 09/27/2017   CL 105 09/27/2017   CO2 27 09/27/2017   GLUCOSE 92 09/27/2017   BUN 18 09/27/2017   CREATININE 0.67 09/27/2017   CALCIUM 9.3 09/27/2017   GFRNONAA 112 09/27/2017   GFRAA 130 09/27/2017      Assessment and Plan  FUO - appears now resolved. Doing well. No need for further abtx  Breast lump = has upcoming ob/gyn appt to evaluate. Has hx of dense breast tissue and previous imaging as part of FUO, thus will see if furhter imaging is needed. Possibly due to hormonal changes.   RTC as needed

## 2017-11-07 ENCOUNTER — Other Ambulatory Visit: Payer: Self-pay | Admitting: Obstetrics and Gynecology

## 2017-11-07 DIAGNOSIS — N6310 Unspecified lump in the right breast, unspecified quadrant: Secondary | ICD-10-CM

## 2017-11-08 ENCOUNTER — Other Ambulatory Visit: Payer: 59

## 2017-11-13 ENCOUNTER — Ambulatory Visit
Admission: RE | Admit: 2017-11-13 | Discharge: 2017-11-13 | Disposition: A | Discharge status: Home / Self Care | Payer: 59 | Source: Ambulatory Visit | Attending: Obstetrics and Gynecology | Admitting: Obstetrics and Gynecology

## 2017-11-13 DIAGNOSIS — N6310 Unspecified lump in the right breast, unspecified quadrant: Secondary | ICD-10-CM

## 2017-11-15 ENCOUNTER — Other Ambulatory Visit: Payer: Self-pay | Admitting: Obstetrics and Gynecology

## 2017-11-15 DIAGNOSIS — R928 Other abnormal and inconclusive findings on diagnostic imaging of breast: Secondary | ICD-10-CM

## 2017-11-15 DIAGNOSIS — N6310 Unspecified lump in the right breast, unspecified quadrant: Secondary | ICD-10-CM

## 2017-11-16 ENCOUNTER — Ambulatory Visit
Admission: RE | Admit: 2017-11-16 | Discharge: 2017-11-16 | Disposition: A | Discharge status: Home / Self Care | Payer: 59 | Source: Ambulatory Visit | Attending: Obstetrics and Gynecology | Admitting: Obstetrics and Gynecology

## 2017-11-16 DIAGNOSIS — N6310 Unspecified lump in the right breast, unspecified quadrant: Secondary | ICD-10-CM | POA: Insufficient documentation

## 2017-11-16 DIAGNOSIS — R928 Other abnormal and inconclusive findings on diagnostic imaging of breast: Secondary | ICD-10-CM

## 2017-11-16 HISTORY — PX: BREAST BIOPSY: SHX20

## 2017-11-17 LAB — SURGICAL PATHOLOGY

## 2018-03-26 HISTORY — PX: BREAST EXCISIONAL BIOPSY: SUR124

## 2019-01-15 IMAGING — MG US  BREAST BX W/ LOC DEV 1ST LESION IMG BX SPEC US GUIDE*R*
1 series · 8 of 8 positions shown · non-contrast
Comparison: Previous exam(s).

ADDENDUM:
Pathology of the right breast biopsy revealed A. BREAST, RIGHT [DATE]
1 CMFN; ULTRASOUND-GUIDED BIOPSY: INTRADUCTAL PAPILLOMA WITH USUAL
DUCTAL HYPERPLASIA. NEGATIVE FOR ATYPIA AND MALIGNANCY.

This was found to be concordant by Dr. Erazo.
Recommendation: Surgical referral for excision.
At the patient's request, results and recommendations were relayed
to the patient by phone by Daaniel, Dhalbhadur ([REDACTED])
on 11/17/17. The patient stated she did well following the biopsy
with no bleeding or pain. Post biopsy instructions were reviewed
with the patient and all of her questions were answered. She
requested that her referral be made to Dr. Delmis at [REDACTED]. This information was relayed to Dr. Misir nurse Kenny by
phone by Etoil on 11/20/17. The nurse stated that they
will arrange the referral. The patient also requested that we notify
Dr. Daaniel, Dhalbhadur, her primary care physician of the results and the
referral to Dr. Aautokaubad Brokans as she has had a previous papilloma with
referral to Dr. Delmis from Dr. Delmis. I relayed this
information to Malhos in Dr. [REDACTED] on 11/22/17 at [DATE]
for Dr. Diamantino nurse with a request for a call back if there were
any questions.
Addendum by Sohil on 11/22/17.
CLINICAL DATA: Mass right breast 5 o'clock for biopsy.
EXAM:
ULTRASOUND GUIDED RIGHT BREAST CORE NEEDLE BIOPSY

[Series 1: MG view · 0.03mm/px · 8 of 12 slices shown]
[im 1/12]
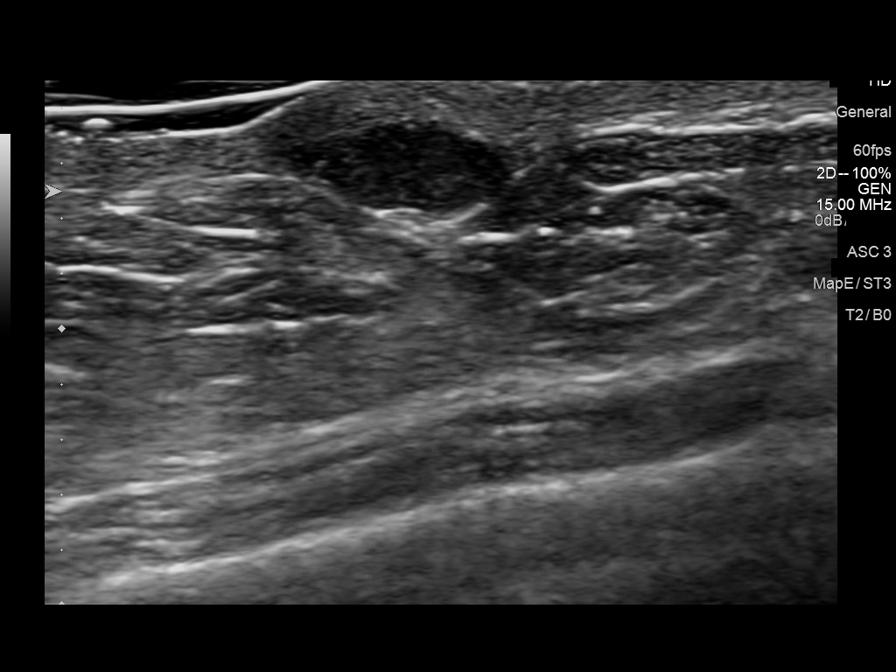
[im 2/12]
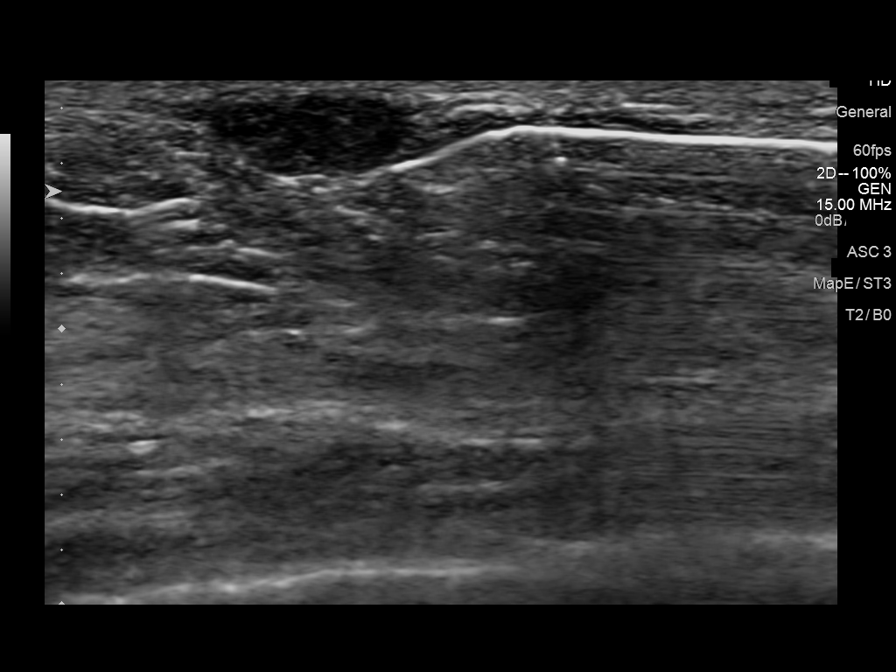
[im 4/12]
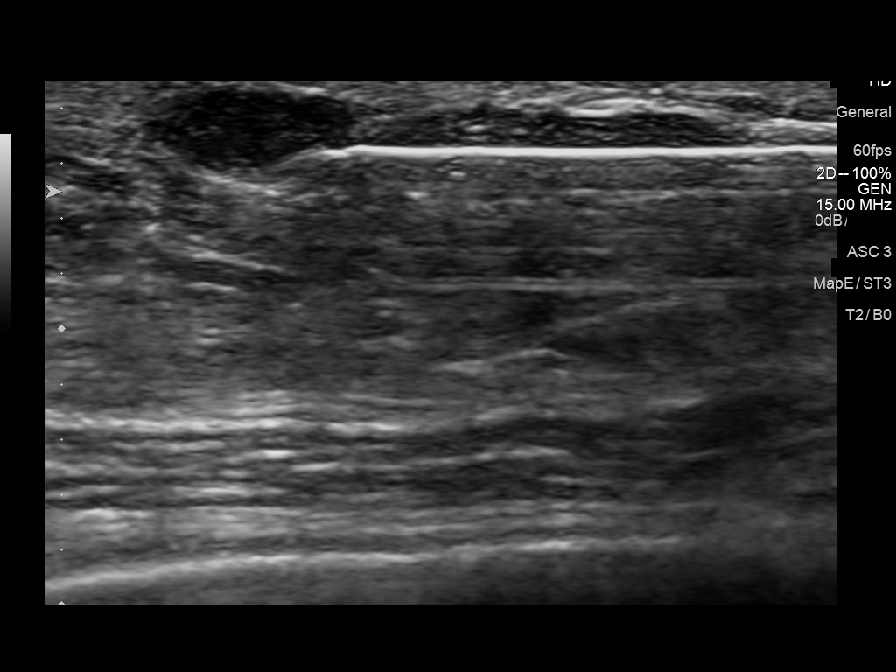
[im 5/12]
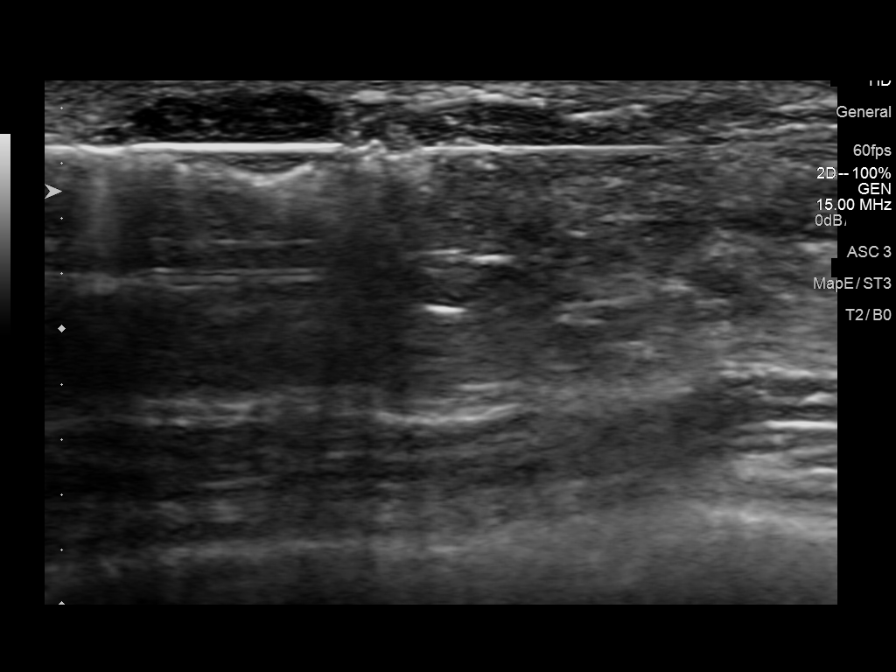
[im 7/12]
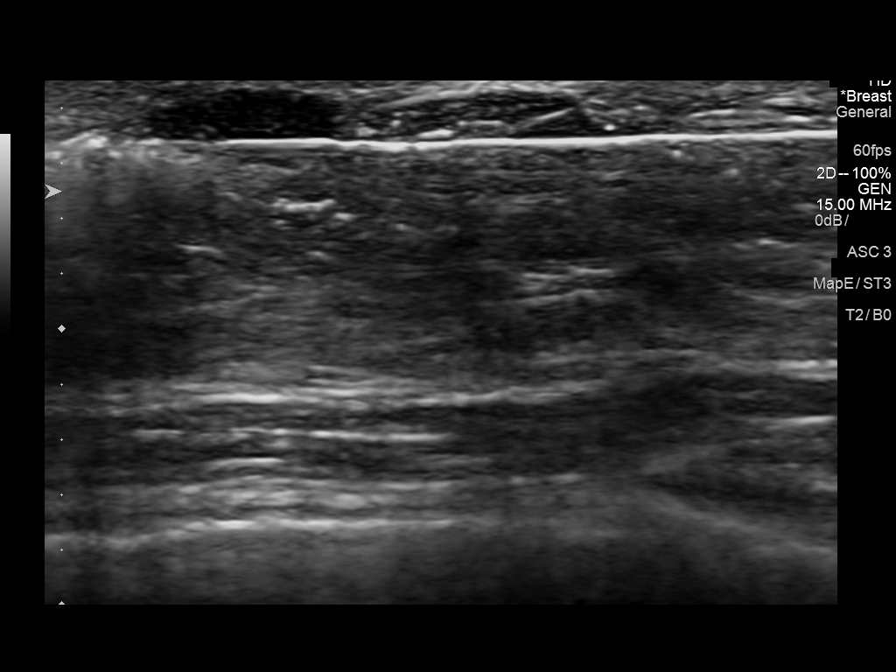
[im 8/12]
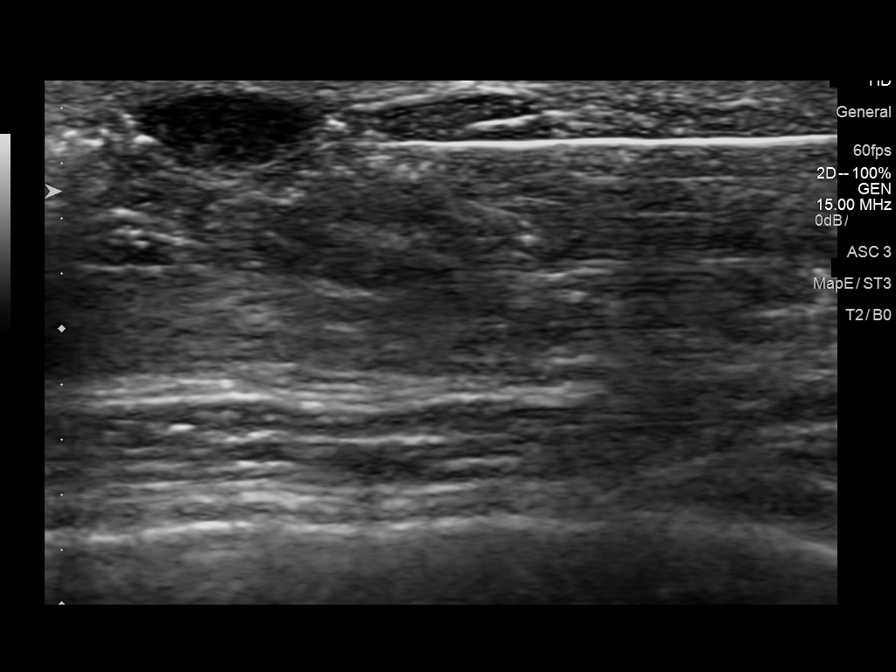
[im 10/12]
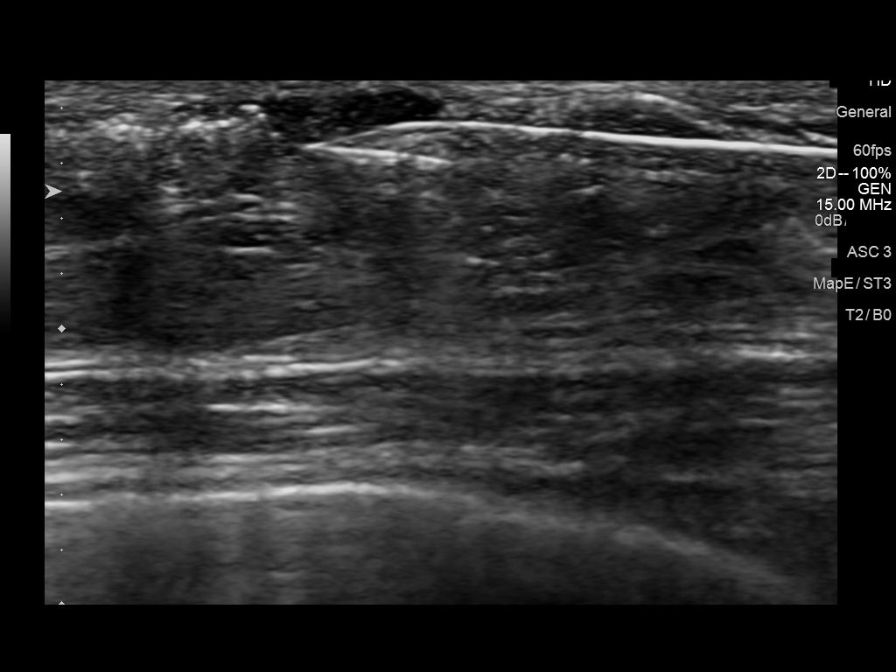
[im 12/12]
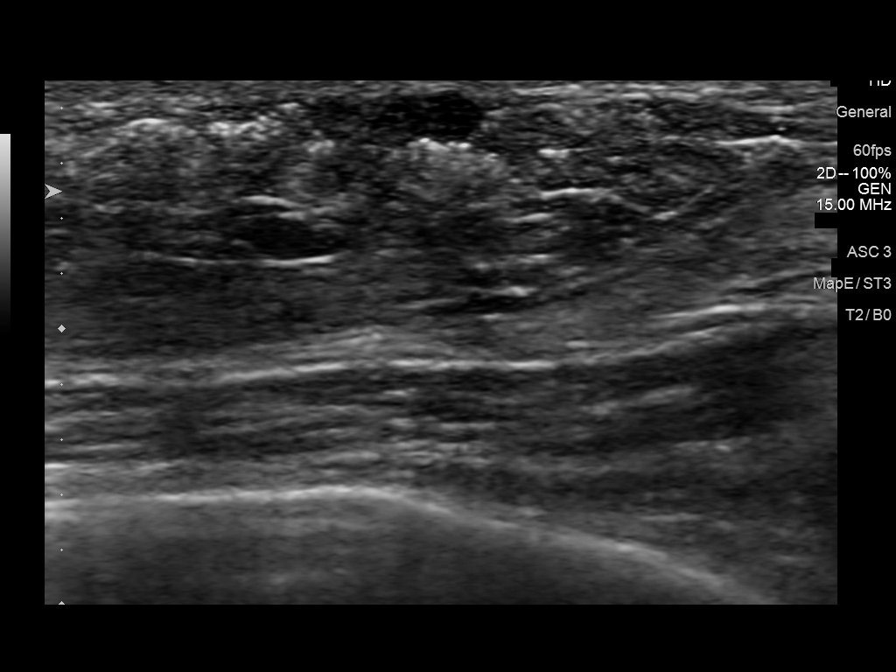

[8 of 8 positions shown; findings below may reference images not displayed]



Lesion quadrant: Lower-inner quadrant

Using sterile technique and 1% Lidocaine as local anesthetic, under
direct ultrasound visualization, a 14 gauge Sohil device was
used to perform biopsy of mass at right breast 5 o'clock 1 cm from
nipple using a medial approach. At the conclusion of the procedure a
coil tissue marker clip was deployed into the biopsy cavity. Follow
up 2 view mammogram was performed and dictated separately.
IMPRESSION: Ultrasound guided biopsy of right breast. No apparent complications.

## 2019-02-15 ENCOUNTER — Other Ambulatory Visit: Payer: Self-pay | Admitting: Orthopedic Surgery

## 2019-02-15 DIAGNOSIS — M7989 Other specified soft tissue disorders: Secondary | ICD-10-CM

## 2019-02-15 DIAGNOSIS — M79601 Pain in right arm: Secondary | ICD-10-CM

## 2019-02-20 ENCOUNTER — Other Ambulatory Visit: Payer: Self-pay | Admitting: Orthopedic Surgery

## 2019-02-20 DIAGNOSIS — M7989 Other specified soft tissue disorders: Secondary | ICD-10-CM

## 2019-02-20 DIAGNOSIS — M79601 Pain in right arm: Secondary | ICD-10-CM

## 2019-03-12 ENCOUNTER — Other Ambulatory Visit: Payer: 59

## 2020-06-22 ENCOUNTER — Ambulatory Visit: Payer: 59 | Admitting: Dermatology

## 2020-11-23 DIAGNOSIS — M7581 Other shoulder lesions, right shoulder: Secondary | ICD-10-CM | POA: Insufficient documentation

## 2021-06-24 ENCOUNTER — Other Ambulatory Visit: Payer: Self-pay | Admitting: Internal Medicine

## 2021-06-24 DIAGNOSIS — Z1231 Encounter for screening mammogram for malignant neoplasm of breast: Secondary | ICD-10-CM

## 2021-08-02 ENCOUNTER — Ambulatory Visit
Admission: RE | Admit: 2021-08-02 | Discharge: 2021-08-02 | Disposition: A | Discharge status: Home / Self Care | Payer: No Typology Code available for payment source | Source: Ambulatory Visit | Attending: Internal Medicine | Admitting: Internal Medicine

## 2021-08-02 DIAGNOSIS — Z1231 Encounter for screening mammogram for malignant neoplasm of breast: Secondary | ICD-10-CM | POA: Insufficient documentation

## 2021-08-03 ENCOUNTER — Inpatient Hospital Stay
Admission: RE | Admit: 2021-08-03 | Discharge: 2021-08-03 | Disposition: A | Discharge status: Home / Self Care | Payer: Self-pay | Source: Ambulatory Visit | Attending: *Deleted | Admitting: *Deleted

## 2021-08-03 ENCOUNTER — Other Ambulatory Visit: Payer: Self-pay | Admitting: *Deleted

## 2021-08-03 DIAGNOSIS — Z1231 Encounter for screening mammogram for malignant neoplasm of breast: Secondary | ICD-10-CM

## 2022-01-17 ENCOUNTER — Ambulatory Visit: Payer: BC Managed Care – PPO | Admitting: Dermatology

## 2022-01-17 DIAGNOSIS — Z808 Family history of malignant neoplasm of other organs or systems: Secondary | ICD-10-CM | POA: Diagnosis not present

## 2022-01-17 DIAGNOSIS — D225 Melanocytic nevi of trunk: Secondary | ICD-10-CM

## 2022-01-17 DIAGNOSIS — L814 Other melanin hyperpigmentation: Secondary | ICD-10-CM | POA: Diagnosis not present

## 2022-01-17 DIAGNOSIS — D1801 Hemangioma of skin and subcutaneous tissue: Secondary | ICD-10-CM

## 2022-01-17 DIAGNOSIS — D229 Melanocytic nevi, unspecified: Secondary | ICD-10-CM

## 2022-01-17 DIAGNOSIS — Z1283 Encounter for screening for malignant neoplasm of skin: Secondary | ICD-10-CM

## 2022-01-17 DIAGNOSIS — L821 Other seborrheic keratosis: Secondary | ICD-10-CM

## 2022-01-17 NOTE — Progress Notes (Signed)
   New Patient Visit  Subjective  Bianca Brennan is a 41 y.o. female who presents for the following: Annual Exam. Family hx of skin cancer. The patient presents for Total-Body Skin Exam (TBSE) for skin cancer screening and mole check.  The patient has spots, moles and lesions to be evaluated, some may be new or changing and the patient has concerns that these could be cancer.   The following portions of the chart were reviewed this encounter and updated as appropriate:   Tobacco  Allergies  Meds  Problems  Med Hx  Surg Hx  Fam Hx     Review of Systems:  No other skin or systemic complaints except as noted in HPI or Assessment and Plan.  Objective  Well appearing patient in no apparent distress; mood and affect are within normal limits.  A full examination was performed including scalp, head, eyes, ears, nose, lips, neck, chest, axillae, abdomen, back, buttocks, bilateral upper extremities, bilateral lower extremities, hands, feet, fingers, toes, fingernails, and toenails. All findings within normal limits unless otherwise noted below.  Right sup medial buttock, right sup lateral buttock  0.4 cm x 0.2 cm flesh-colored symmetric macule at right medial buttock 0.4 cm x 0.2 cm flesh-colored symmetric macules at right lateral sup buttock         Assessment & Plan  Family history of skin cancer unknown Family hx of skin cancer   Nevus (2) Right sup medial buttock; right sup lateral buttock See photo Benign-appearing.  Observation.  Call clinic for new or changing moles.  Recommend daily use of broad spectrum spf 30+ sunscreen to sun-exposed areas.    Lentigines - Scattered tan macules - Due to sun exposure - Benign-appearing, observe - Recommend daily broad spectrum sunscreen SPF 30+ to sun-exposed areas, reapply every 2 hours as needed. - Call for any changes  Seborrheic Keratoses - Stuck-on, waxy, tan-brown papules and/or plaques  - Benign-appearing - Discussed  benign etiology and prognosis. - Observe - Call for any changes  Melanocytic Nevi - Tan-brown and/or pink-flesh-colored symmetric macules and papules - Benign appearing on exam today - Observation - Call clinic for new or changing moles - Recommend daily use of broad spectrum spf 30+ sunscreen to sun-exposed areas.   Hemangiomas - Red papules - Discussed benign nature - Observe - Call for any changes  Skin cancer screening performed today.   Return in about 1 year (around 01/18/2023) for TBSE.  IMarye Round, CMA, am acting as scribe for Sarina Ser, MD .  Documentation: I have reviewed the above documentation for accuracy and completeness, and I agree with the above.  Sarina Ser, MD

## 2022-01-17 NOTE — Patient Instructions (Signed)
Due to recent changes in healthcare laws, you may see results of your pathology and/or laboratory studies on MyChart before the doctors have had a chance to review them. We understand that in some cases there may be results that are confusing or concerning to you. Please understand that not all results are received at the same time and often the doctors may need to interpret multiple results in order to provide you with the best plan of care or course of treatment. Therefore, we ask that you please give us 2 business days to thoroughly review all your results before contacting the office for clarification. Should we see a critical lab result, you will be contacted sooner.   If You Need Anything After Your Visit  If you have any questions or concerns for your doctor, please call our main line at [phone] and press option 4 to reach your doctor's medical assistant. If no one answers, please leave a voicemail as directed and we will return your call as soon as possible. Messages left after 4 pm will be answered the following business day.   You may also send us a message via MyChart. We typically respond to MyChart messages within 1-2 business days.  For prescription refills, please ask your pharmacy to contact our office. Our fax number is [phone].  If you have an urgent issue when the clinic is closed that cannot wait until the next business day, you can page your doctor at the number below.    Please note that while we do our best to be available for urgent issues outside of office hours, we are not available 24/7.   If you have an urgent issue and are unable to reach us, you may choose to seek medical care at your doctor's office, retail clinic, urgent care center, or emergency room.  If you have a medical emergency, please immediately call 911 or go to the emergency department.  Pager Numbers  - Dr. Kowalski: [phone]  - Dr. Moye: [phone]  - Dr. Stewart:  [phone]  In the event of inclement weather, please call our main line at [phone] for an update on the status of any delays or closures.  Dermatology Medication Tips: Please keep the boxes that topical medications come in in order to help keep track of the instructions about where and how to use these. Pharmacies typically print the medication instructions only on the boxes and not directly on the medication tubes.   If your medication is too expensive, please contact our office at [phone] option 4 or send us a message through MyChart.   We are unable to tell what your co-pay for medications will be in advance as this is different depending on your insurance coverage. However, we may be able to find a substitute medication at lower cost or fill out paperwork to get insurance to cover a needed medication.   If a prior authorization is required to get your medication covered by your insurance company, please allow us 1-2 business days to complete this process.  Drug prices often vary depending on where the prescription is filled and some pharmacies may offer cheaper prices.  The website www.goodrx.com contains coupons for medications through different pharmacies. The prices here do not account for what the cost may be with help from insurance (it may be cheaper with your insurance), but the website can give you the price if you did not use any insurance.  - You can print the associated coupon and take it with   your prescription to the pharmacy.  - You may also stop by our office during regular business hours and pick up a GoodRx coupon card.  - If you need your prescription sent electronically to a different pharmacy, notify our office through [facility] MyChart or by phone at [phone] option 4.     Si Usted Necesita Algo Despus de Su Visita  Tambin puede enviarnos un mensaje a travs de MyChart. Por lo general respondemos a los mensajes de MyChart en el transcurso de 1 a 2  das hbiles.  Para renovar recetas, por favor pida a su farmacia que se ponga en contacto con nuestra oficina. Nuestro nmero de fax es el [phone].  Si tiene un asunto urgente cuando la clnica est cerrada y que no puede esperar hasta el siguiente da hbil, puede llamar/localizar a su doctor(a) al nmero que aparece a continuacin.   Por favor, tenga en cuenta que aunque hacemos todo lo posible para estar disponibles para asuntos urgentes fuera del horario de oficina, no estamos disponibles las 24 horas del da, los 7 das de la semana.   Si tiene un problema urgente y no puede comunicarse con nosotros, puede optar por buscar atencin mdica  en el consultorio de su doctor(a), en una clnica privada, en un centro de atencin urgente o en una sala de emergencias.  Si tiene una emergencia mdica, por favor llame inmediatamente al 911 o vaya a la sala de emergencias.  Nmeros de bper  - Dr. Kowalski: [phone]  - Dra. Moye: [phone]  - Dra. Stewart: [phone]  En caso de inclemencias del tiempo, por favor llame a nuestra lnea principal al [phone] para una actualizacin sobre el estado de cualquier retraso o cierre.  Consejos para la medicacin en dermatologa: Por favor, guarde las cajas en las que vienen los medicamentos de uso tpico para ayudarle a seguir las instrucciones sobre dnde y cmo usarlos. Las farmacias generalmente imprimen las instrucciones del medicamento slo en las cajas y no directamente en los tubos del medicamento.   Si su medicamento es muy caro, por favor, pngase en contacto con nuestra oficina llamando al [phone] y presione la opcin 4 o envenos un mensaje a travs de MyChart.   No podemos decirle cul ser su copago por los medicamentos por adelantado ya que esto es diferente dependiendo de la cobertura de su seguro. Sin embargo, es posible que podamos encontrar un medicamento sustituto a menor costo o llenar un formulario para que el  seguro cubra el medicamento que se considera necesario.   Si se requiere una autorizacin previa para que su compaa de seguros cubra su medicamento, por favor permtanos de 1 a 2 das hbiles para completar este proceso.  Los precios de los medicamentos varan con frecuencia dependiendo del lugar de dnde se surte la receta y alguna farmacias pueden ofrecer precios ms baratos.  El sitio web www.goodrx.com tiene cupones para medicamentos de diferentes farmacias. Los precios aqu no tienen en cuenta lo que podra costar con la ayuda del seguro (puede ser ms barato con su seguro), pero el sitio web puede darle el precio si no utiliz ningn seguro.  - Puede imprimir el cupn correspondiente y llevarlo con su receta a la farmacia.  - Tambin puede pasar por nuestra oficina durante el horario de atencin regular y recoger una tarjeta de cupones de GoodRx.  - Si necesita que su receta se enve electrnicamente a una farmacia diferente, informe a nuestra oficina a travs de MyChart de [place]   o por telfono llamando al [phone] y presione la opcin 4.  

## 2022-01-28 ENCOUNTER — Encounter: Payer: Self-pay | Admitting: Dermatology

## 2022-04-27 ENCOUNTER — Ambulatory Visit
Admission: EM | Admit: 2022-04-27 | Discharge: 2022-04-27 | Disposition: A | Discharge status: Home / Self Care | Payer: BC Managed Care – PPO | Attending: Urgent Care | Admitting: Urgent Care

## 2022-04-27 DIAGNOSIS — J019 Acute sinusitis, unspecified: Secondary | ICD-10-CM | POA: Diagnosis not present

## 2022-04-27 DIAGNOSIS — B9689 Other specified bacterial agents as the cause of diseases classified elsewhere: Secondary | ICD-10-CM

## 2022-04-27 MED ORDER — DOXYCYCLINE HYCLATE 100 MG PO CAPS: 100.0000 mg | ORAL_CAPSULE | Freq: Two times a day (BID) | ORAL | 0 refills | Status: AC

## 2022-04-27 MED ORDER — PREDNISONE 20 MG PO TABS: 60.0000 mg | ORAL_TABLET | Freq: Every day | ORAL | 0 refills | Status: AC

## 2022-04-27 NOTE — Discharge Instructions (Signed)
Follow up here or with your primary care provider if your symptoms are worsening or not improving with treatment.     

## 2022-04-27 NOTE — ED Provider Notes (Signed)
Roderic Palau    CSN: [id number] Arrival date & time: 04/27/22  1903      History   Chief Complaint No chief complaint on file.   HPI Bianca Brennan is a 42 y.o. female.   HPI  Presents to urgent care complaining of sinus pressure, nasal congestion, ear pain starting this morning.  Patient endorses being exposed to flu last week with symptoms developing, and then improving.  She states the symptoms started suddenly this morning.  Past Medical History:  Diagnosis Date   Anxiety    related to CLBP   Depression    related to CLBP   Hashimoto's disease     Patient Active Problem List   Diagnosis Date Noted   Labor and delivery indication for care or intervention 04/29/2017   Advanced maternal age in multigravida, second trimester     Past Surgical History:  Procedure Laterality Date   BREAST BIOPSY Right 11/16/2017   neg/ papilloma   BREAST EXCISIONAL BIOPSY Right 2014   papilloma   BREAST EXCISIONAL BIOPSY Right 03/26/2018   [facility]/ papilloma   BREAST SURGERY     DILATION AND CURETTAGE OF UTERUS     Interductal papilloma removal   2014   R breast     OB History     Gravida  4   Para  2   Term  2   Preterm      AB  1   Living  2      SAB  1   IAB      Ectopic      Multiple      Live Births               Home Medications    Prior to Admission medications   Medication Sig Start Date End Date Taking? Authorizing Provider  cholecalciferol (VITAMIN D) 1000 UNITS tablet Take 1,000 Units by mouth daily.    [provider]  cyclobenzaprine (FLEXERIL) 5 MG tablet Take 5 mg by mouth 3 (three) times daily as needed for muscle spasms.    [provider]  ferrous sulfate 325 (65 FE) MG tablet Take 1 tablet (325 mg total) by mouth daily with breakfast. Take with Vitamin C 05/01/17 06/30/17  Benjaman Kindler, MD  ibuprofen (ADVIL,MOTRIN) 800 MG tablet Take 1 tablet (800 mg total) by mouth every 8 (eight) hours as needed for  moderate pain or cramping. Patient not taking: Reported on 10/23/2017 05/01/17   Benjaman Kindler, MD  levothyroxine (SYNTHROID, LEVOTHROID) 100 MCG tablet Take 100 mcg by mouth daily before breakfast.    [provider]  liothyronine (CYTOMEL) 5 MCG tablet Take 5 mcg by mouth daily.    [provider]  sertraline (ZOLOFT) 100 MG tablet Take 100 mg by mouth daily.    [provider]  tizanidine (ZANAFLEX) 2 MG capsule Take 2 mg by mouth 3 (three) times daily.    [provider]    Family History Family History  Problem Relation Age of Onset   Diabetes Mother    Hyperlipidemia Mother    Diabetes Father    Hyperlipidemia Father    Heart disease Maternal Grandfather    Heart disease Paternal Grandfather    Breast cancer Maternal Grandmother        50's    Social History Social History   Tobacco Use   Smoking status: Never   Smokeless tobacco: Never  Vaping Use   Vaping Use: Never used  Substance  Use Topics   Alcohol use: No   Drug use: No     Allergies   Penicillins and Dicloxacillin   Review of Systems Review of Systems   Physical Exam Triage Vital Signs ED Triage Vitals [04/27/22 1923]  Enc Vitals Group     BP 112/69     Pulse Rate 85     Resp 16     Temp 98.6 F (37 C)     Temp Source Oral     SpO2 95 %     Weight      Height      Head Circumference      Peak Flow      Pain Score      Pain Loc      Pain Edu?      Excl. in [place]?    No data found.  Updated Vital Signs BP 112/69 (BP Location: Left Arm)   Pulse 85   Temp 98.6 F (37 C) (Oral)   Resp 16   SpO2 95%   Visual Acuity Right Eye Distance:   Left Eye Distance:   Bilateral Distance:    Right Eye Near:   Left Eye Near:    Bilateral Near:     Physical Exam Vitals reviewed.  HENT:     Right Ear: A middle ear effusion is present.     Left Ear: A middle ear effusion is present.     Nose:     Right Sinus: Maxillary sinus tenderness present.     Left  Sinus: Maxillary sinus tenderness present.  Skin:    General: Skin is warm and dry.  Neurological:     General: No focal deficit present.     Mental Status: She is oriented to person, place, and time.  Psychiatric:        Mood and Affect: Mood normal.        Behavior: Behavior normal.      UC Treatments / Results  Labs (all labs ordered are listed, but only abnormal results are displayed) Labs Reviewed - No data to display  EKG   Radiology No results found.  Procedures Procedures (including critical care time)  Medications Ordered in UC Medications - No data to display  Initial Impression / Assessment and Plan / UC Course  I have reviewed the triage vital signs and the nursing notes.  Pertinent labs & imaging results that were available during my care of the patient were reviewed by me and considered in my medical decision making (see chart for details).   Patient is afebrile here without recent antipyretics. Satting well on room air. Overall is well appearing, well hydrated, without respiratory distress. Mid-ear effusion bilaterally. Sinus tenderness.  Patient's symptoms are consistent with a secondary bacterial infection.  She has intolerance of penicillins and so we will treat with doxycycline.  Recommending taking this medication with food.  Will also treat with a course of prednisone.  Final Clinical Impressions(s) / UC Diagnoses   Final diagnoses:  None   Discharge Instructions   None    ED Prescriptions   None    PDMP not reviewed this encounter.   Rose Phi, [place] 04/27/22 1940

## 2022-04-27 NOTE — ED Triage Notes (Signed)
Pt. presents to UC w/ c/o sinus pressure, nasal congestion and ear pain that started this morning. Pt. States she was exposed to the FLU last week and got better but then these symptoms appeared this morning.

## 2022-05-07 ENCOUNTER — Other Ambulatory Visit
Admission: RE | Admit: 2022-05-07 | Discharge: 2022-05-07 | Disposition: A | Discharge status: Home / Self Care | Payer: BC Managed Care – PPO | Source: Ambulatory Visit | Attending: Family Medicine | Admitting: Family Medicine

## 2022-05-07 DIAGNOSIS — R0789 Other chest pain: Secondary | ICD-10-CM | POA: Insufficient documentation

## 2022-05-07 DIAGNOSIS — K219 Gastro-esophageal reflux disease without esophagitis: Secondary | ICD-10-CM | POA: Diagnosis present

## 2022-05-07 DIAGNOSIS — R09A2 Foreign body sensation, throat: Secondary | ICD-10-CM | POA: Diagnosis not present

## 2022-05-07 DIAGNOSIS — R079 Chest pain, unspecified: Secondary | ICD-10-CM | POA: Diagnosis present

## 2022-05-07 DIAGNOSIS — R1013 Epigastric pain: Secondary | ICD-10-CM | POA: Insufficient documentation

## 2022-05-07 LAB — LIPASE, BLOOD: Lipase: 64 U/L — ABNORMAL HIGH (ref 11–51)

## 2022-05-07 LAB — COMPREHENSIVE METABOLIC PANEL
ALT: 17 U/L (ref 0–44)
AST: 18 U/L (ref 15–41)
Albumin: 4.4 g/dL (ref 3.5–5.0)
Alkaline Phosphatase: 42 U/L (ref 38–126)
Anion gap: 7 (ref 5–15)
BUN: 18 mg/dL (ref 6–20)
CO2: 29 mmol/L (ref 22–32)
Calcium: 9 mg/dL (ref 8.9–10.3)
Chloride: 100 mmol/L (ref 98–111)
Creatinine, Ser: 0.7 mg/dL (ref 0.44–1.00)
GFR, Estimated: >60 mL/min (ref >60)
Glucose, Bld: 117 mg/dL — ABNORMAL HIGH (ref 70–99)
Potassium: 3.7 mmol/L (ref 3.5–5.1)
Sodium: 136 mmol/L (ref 135–145)
Total Bilirubin: 0.9 mg/dL (ref 0.3–1.2)
Total Protein: 7.6 g/dL (ref 6.5–8.1)

## 2023-01-19 ENCOUNTER — Ambulatory Visit: Payer: BC Managed Care – PPO | Admitting: Dermatology

## 2023-01-19 DIAGNOSIS — W908XXA Exposure to other nonionizing radiation, initial encounter: Secondary | ICD-10-CM | POA: Diagnosis not present

## 2023-01-19 DIAGNOSIS — L814 Other melanin hyperpigmentation: Secondary | ICD-10-CM

## 2023-01-19 DIAGNOSIS — Z1283 Encounter for screening for malignant neoplasm of skin: Secondary | ICD-10-CM

## 2023-01-19 DIAGNOSIS — L578 Other skin changes due to chronic exposure to nonionizing radiation: Secondary | ICD-10-CM

## 2023-01-19 DIAGNOSIS — D229 Melanocytic nevi, unspecified: Secondary | ICD-10-CM

## 2023-01-19 DIAGNOSIS — D119 Benign neoplasm of major salivary gland, unspecified: Secondary | ICD-10-CM

## 2023-01-19 DIAGNOSIS — L821 Other seborrheic keratosis: Secondary | ICD-10-CM

## 2023-01-19 DIAGNOSIS — I781 Nevus, non-neoplastic: Secondary | ICD-10-CM

## 2023-01-19 DIAGNOSIS — I8393 Asymptomatic varicose veins of bilateral lower extremities: Secondary | ICD-10-CM

## 2023-01-19 DIAGNOSIS — D225 Melanocytic nevi of trunk: Secondary | ICD-10-CM

## 2023-01-19 NOTE — Progress Notes (Signed)
   Follow-Up Visit   Subjective  Bianca Brennan is a 42 y.o. female who presents for the following: Skin Cancer Screening and Full Body Skin Exam  Right cheek dry area   The patient presents for Total-Body Skin Exam (TBSE) for skin cancer screening and mole check. The patient has spots, moles and lesions to be evaluated, some may be new or changing and the patient may have concern these could be cancer.  The following portions of the chart were reviewed this encounter and updated as appropriate: medications, allergies, medical history  Review of Systems:  No other skin or systemic complaints except as noted in HPI or Assessment and Plan.  Objective  Well appearing patient in no apparent distress; mood and affect are within normal limits.  A full examination was performed including scalp, head, eyes, ears, nose, lips, neck, chest, axillae, abdomen, back, buttocks, bilateral upper extremities, bilateral lower extremities, hands, feet, fingers, toes, fingernails, and toenails. All findings within normal limits unless otherwise noted below.   Relevant physical exam findings are noted in the Assessment and Plan.    Assessment & Plan   SKIN CANCER SCREENING PERFORMED TODAY.  ACTINIC DAMAGE - Chronic condition, secondary to cumulative UV/sun exposure - diffuse scaly erythematous macules with underlying dyspigmentation - Recommend daily broad spectrum sunscreen SPF 30+ to sun-exposed areas, reapply every 2 hours as needed.  - Staying in the shade or wearing long sleeves, sun glasses (UVA+UVB protection) and wide brim hats (4-inch brim around the entire circumference of the hat) are also recommended for sun protection.  - Call for new or changing lesions.  LENTIGINES, SEBORRHEIC KERATOSES, HEMANGIOMAS - Benign normal skin lesions - Benign-appearing - Call for any changes  MELANOCYTIC NEVI - Tan-brown and/or pink-flesh-colored symmetric macules and papules - Benign appearing on exam  today - Observation - Call clinic for new or changing moles - Recommend daily use of broad spectrum spf 30+ sunscreen to sun-exposed areas.   Varicose Veins/Spider Veins - Dilated blue, purple or red veins at the lower extremities - Reassured - Smaller vessels can be treated by sclerotherapy (a procedure to inject a medicine into the veins to make them disappear) if desired, but the treatment is not covered by insurance. Larger vessels may be covered if symptomatic and we would refer to vascular surgeon if treatment desired.  Nevus (2) Right sup medial buttock; right sup lateral buttock See photo  Exam:   0.4 cm x 0.2 cm flesh-colored symmetric macule at right medial buttock 0.4 cm x 0.2 cm flesh-colored symmetric macules at right lateral sup buttock   Benign-appearing. Stable compared to previous visit. Observation.  Call clinic for new or changing moles.  Recommend daily use of broad spectrum spf 30+ sunscreen to sun-exposed areas.    Return in about 1 year (around 01/19/2024) for TBSE.  IAsher Muir, CMA, am acting as scribe for Armida Sans, MD.   Documentation: I have reviewed the above documentation for accuracy and completeness, and I agree with the above.  Armida Sans, MD

## 2023-01-19 NOTE — Patient Instructions (Addendum)
BEFORE YOUR APPOINTMENT FOR SCLEROTHERAPY  1. When you telephone for your appointment for the sclerotherapy procedure, please let the receptionist know that you are scheduling for the fifteen (15) minute sclerotherapy procedure not just a regular visit.  2. On the day of the procedure, please cleanse and dry the areas, but do not use any moisturizers or other products on the area(s) to be treated.  3. Bring a pair of comfortable shorts to wear during the procedure.  4. Be sure to bring your recommended graduated compression stockings with you to the office. You will be wearing them home when your visit is over. These compression hose can be purchased at most medical supply stores.  After Your Sclerotherapy Procedure  1. Please wear the graduated compression stockings for 24 hours immediately following the completion of the sclerotherapy procedure.  2. We recommend that you avoid vigorous activity as much as possible for the first twenty-four (24) hours. You can do your "normal" routine, but avoid an above normal amount of time on your feet. Elevating the legs when sitting and avoidance of vigorous leg movements or exercise in the first few days after treatment may improve your results.  3. You may remove the compression dressings (cotton balls) and tape the next morning.  4. Please continue wearing the compression stockings during waking hours for the two (2) weeks following sclerotherapy.  5. If you have any blisters, sores or ulcers or other problems following your procedure please call or return to the office immediately.     THE PROCEDURE FEE IS $350.00 PER FIFTEEN (15) MINUTE SESSION. WE REQUIRE THAT THIS PROCEDURE BE PAID FOR IN FULL ON OR BEFORE THE DATE THAT IT IS PERFORMED. WE WILL GIVE YOU A RECEIPT THAT YOU CAN FILE WITH YOUR INSURANCE COMPANY. WE GENERALLY DO NOT FILE THIS PROCEDURE WITH ANY INSURANCE COMPANY EXCEPT UNDER CERTAIN CIRCUMSTANCES WHERE PRIOR AUTHORIZATION HAS  BEEN CONFIRMED. THIS PROCEDURE IS GENERALLY CONSIDERED TO BE A COSMETIC PROCEDURE BY INSURANCE COMPANIES.   Melanoma ABCDEs  Melanoma is the most dangerous type of skin cancer, and is the leading cause of death from skin disease.  You are more likely to develop melanoma if you: Have light-colored skin, light-colored eyes, or red or blond hair Spend a lot of time in the sun Tan regularly, either outdoors or in a tanning bed Have had blistering sunburns, especially during childhood Have a close family member who has had a melanoma Have atypical moles or large birthmarks  Early detection of melanoma is key since treatment is typically straightforward and cure rates are extremely high if we catch it early.   The first sign of melanoma is often a change in a mole or a new dark spot.  The ABCDE system is a way of remembering the signs of melanoma.  A for asymmetry:  The two halves do not match. B for border:  The edges of the growth are irregular. C for color:  A mixture of colors are present instead of an even brown color. D for diameter:  Melanomas are usually (but not always) greater than 6mm - the size of a pencil eraser. E for evolution:  The spot keeps changing in size, shape, and color.  Please check your skin once per month between visits. You can use a small mirror in front and a large mirror behind you to keep an eye on the back side or your body.   If you see any new or changing lesions before your next  follow-up, please call to schedule a visit.  Please continue daily skin protection including broad spectrum sunscreen SPF 30+ to sun-exposed areas, reapplying every 2 hours as needed when you're outdoors.   Staying in the shade or wearing long sleeves, sun glasses (UVA+UVB protection) and wide brim hats (4-inch brim around the entire circumference of the hat) are also recommended for sun protection.    Due to recent changes in healthcare laws, you may see results of your pathology  and/or laboratory studies on MyChart before the doctors have had a chance to review them. We understand that in some cases there may be results that are confusing or concerning to you. Please understand that not all results are received at the same time and often the doctors may need to interpret multiple results in order to provide you with the best plan of care or course of treatment. Therefore, we ask that you please give [place] 2 business days to thoroughly review all your results before contacting the office for clarification. Should we see a critical lab result, you will be contacted sooner.   If You Need Anything After Your Visit  If you have any questions or concerns for your doctor, please call our main line at [phone] and press option 4 to reach your doctor's medical assistant. If no one answers, please leave a voicemail as directed and we will return your call as soon as possible. Messages left after 4 pm will be answered the following business day.   You may also send [place] a message via MyChart. We typically respond to MyChart messages within 1-2 business days.  For prescription refills, please ask your pharmacy to contact our office. Our fax number is [phone].  If you have an urgent issue when the clinic is closed that cannot wait until the next business day, you can page your doctor at the number below.    Please note that while we do our best to be available for urgent issues outside of office hours, we are not available 24/7.   If you have an urgent issue and are unable to reach [place], you may choose to seek medical care at your doctor's office, retail clinic, urgent care center, or emergency room.  If you have a medical emergency, please immediately call 911 or go to the emergency department.  Pager Numbers  - Dr. Gwen Pounds: [phone]  - Dr. Roseanne Reno: [phone]  - Dr. Katrinka Blazing: [phone]   In the event of inclement weather, please call our main line at [phone]  for an update on the status of any delays or closures.  Dermatology Medication Tips: Please keep the boxes that topical medications come in in order to help keep track of the instructions about where and how to use these. Pharmacies typically print the medication instructions only on the boxes and not directly on the medication tubes.   If your medication is too expensive, please contact our office at [phone] option 4 or send [place] a message through MyChart.   We are unable to tell what your co-pay for medications will be in advance as this is different depending on your insurance coverage. However, we may be able to find a substitute medication at lower cost or fill out paperwork to get insurance to cover a needed medication.   If a prior authorization is required to get your medication covered by your insurance company, please allow [place] 1-2 business days to complete this process.  Drug prices often vary depending on where the prescription is  filled and some pharmacies may offer cheaper prices.  The website www.goodrx.com contains coupons for medications through different pharmacies. The prices here do not account for what the cost may be with help from insurance (it may be cheaper with your insurance), but the website can give you the price if you did not use any insurance.  - You can print the associated coupon and take it with your prescription to the pharmacy.  - You may also stop by our office during regular business hours and pick up a GoodRx coupon card.  - If you need your prescription sent electronically to a different pharmacy, notify our office through [facility] or by phone at [phone] option 4.     Si Usted Necesita Algo Despus de Su Visita  Tambin puede enviarnos un mensaje a travs de Clinical cytogeneticist. Por lo general respondemos a los mensajes de MyChart en el transcurso de 1 a 2 das hbiles.  Para renovar recetas, por favor pida a su farmacia que se ponga en contacto  con nuestra oficina. Annie Sable de fax es [place] [phone].  Si tiene un asunto urgente cuando la clnica est cerrada y que no puede esperar hasta el siguiente da hbil, puede llamar/localizar a su doctor(a) al nmero que aparece a continuacin.   Por favor, tenga en cuenta que aunque hacemos todo lo posible para estar disponibles para asuntos urgentes fuera del horario de [place], no estamos disponibles las 24 horas del da, los 7 [address]  [address] de la [place].   Si tiene un problema urgente y no puede comunicarse con nosotros, puede optar por buscar atencin mdica  en el consultorio de su doctor(a), en una clnica privada, en un centro de atencin urgente o en una sala de emergencias.  Si tiene Engineer, drilling, por favor llame inmediatamente al 911 o vaya a la sala de emergencias.  Nmeros de bper  - Dr. Gwen Pounds: [phone]  - Dra. Roseanne Reno: [phone]  - Dr. Katrinka Blazing: [phone]   En caso de inclemencias del tiempo, por favor llame a Lacy Duverney principal al [phone] para una actualizacin sobre el [place] de cualquier retraso o cierre.  Consejos para la medicacin en dermatologa: Por favor, guarde las cajas en las que vienen los medicamentos de uso tpico para ayudarle a seguir las instrucciones sobre dnde y cmo usarlos. Las farmacias generalmente imprimen las instrucciones del medicamento slo en las cajas y no directamente en los tubos del [place].   Si su medicamento es muy caro, por favor, pngase en contacto con Rolm Gala llamando al [phone] y presione la opcin 4 o envenos un mensaje a travs de Clinical cytogeneticist.   No podemos decirle cul ser su copago por los medicamentos por adelantado ya que esto es diferente dependiendo de la cobertura de su seguro. Sin embargo, es posible que podamos encontrar un medicamento sustituto a Audiological scientist un formulario para que el seguro cubra el medicamento que se considera necesario.   Si se requiere una autorizacin  previa para que su compaa de seguros [place] su medicamento, por favor permtanos de 1 a 2 das hbiles para completar [address]  Los precios de los medicamentos varan con frecuencia dependiendo del Environmental consultant de dnde se surte la receta y alguna farmacias pueden ofrecer precios ms baratos.  El sitio web www.goodrx.com tiene cupones para medicamentos de Health and safety inspector. Los precios aqu no tienen en cuenta lo que podra costar con la ayuda del seguro (puede ser ms barato con su seguro), pero el sitio web puede  darle el precio si no utiliz [employer].  - Puede imprimir el cupn correspondiente y llevarlo con su receta a la farmacia.  - Tambin puede pasar por nuestra oficina durante el horario de atencin regular y Education officer, museum una tarjeta de cupones de GoodRx.  - Si necesita que su receta se enve electrnicamente a una farmacia diferente, informe a nuestra oficina a travs de MyChart de [place] o por telfono llamando al [phone] y presione la opcin 4.

## 2023-02-04 ENCOUNTER — Encounter: Payer: Self-pay | Admitting: Dermatology

## 2023-02-15 ENCOUNTER — Ambulatory Visit: Payer: Self-pay | Admitting: Dermatology

## 2023-07-25 ENCOUNTER — Ambulatory Visit: Admitting: Dermatology

## 2023-07-25 ENCOUNTER — Encounter: Payer: Self-pay | Admitting: Dermatology

## 2023-07-25 DIAGNOSIS — C44529 Squamous cell carcinoma of skin of other part of trunk: Secondary | ICD-10-CM

## 2023-07-25 DIAGNOSIS — D485 Neoplasm of uncertain behavior of skin: Secondary | ICD-10-CM

## 2023-07-25 DIAGNOSIS — C4492 Squamous cell carcinoma of skin, unspecified: Secondary | ICD-10-CM

## 2023-07-25 DIAGNOSIS — D492 Neoplasm of unspecified behavior of bone, soft tissue, and skin: Secondary | ICD-10-CM

## 2023-07-25 HISTORY — DX: Squamous cell carcinoma of skin, unspecified: C44.92

## 2023-07-25 NOTE — Progress Notes (Signed)
   Follow-Up Visit   Subjective  Bianca Brennan is a 43 y.o. female who presents for the following: bump on the chest for a few weeks. Started as a rash on the chest that may have been caused by a new cream, but bump remained and hasn't resolved. She thinks the lesion may be changing and growing in size.  The patient has spots, moles and lesions to be evaluated, some may be new or changing and the patient may have concern these could be cancer.   The following portions of the chart were reviewed this encounter and updated as appropriate: medications, allergies, medical history  Review of Systems:  No other skin or systemic complaints except as noted in HPI or Assessment and Plan.  Objective  Well appearing patient in no apparent distress; mood and affect are within normal limits.   A focused examination was performed of the following areas:   Relevant exam findings are noted in the Assessment and Plan.  Chest 0.3 cm pink scaly papule   Assessment & Plan     NEOPLASM OF UNCERTAIN BEHAVIOR OF SKIN Chest Skin / nail biopsy Type of biopsy: tangential   Informed consent: discussed and consent obtained   Timeout: patient name, date of birth, surgical site, and procedure verified   Procedure prep:  Patient was prepped and draped in usual sterile fashion Prep type:  Isopropyl alcohol Anesthesia: the lesion was anesthetized in a standard fashion   Anesthetic:  1% lidocaine  w/ epinephrine 1-100,000 buffered w/ 8.4% NaHCO3 Instrument used: flexible razor blade   Hemostasis achieved with: pressure, aluminum chloride and electrodesiccation   Outcome: patient tolerated procedure well   Post-procedure details: sterile dressing applied and wound care instructions given   Dressing type: bandage (Mupirocin 2% ointment)   Specimen 1 - Surgical pathology Differential Diagnosis: D48.5 r/o verruca vs SCC Two small pieces in specimen container Check Margins: No  Return for appointment as  scheduled.  Bianca Brennan, CMA, am acting as scribe for Harris Liming, MD .  Documentation: I have reviewed the above documentation for accuracy and completeness, and I agree with the above.  Harris Liming, MD

## 2023-07-25 NOTE — Patient Instructions (Signed)

## 2023-08-03 LAB — SURGICAL PATHOLOGY

## 2023-08-09 ENCOUNTER — Telehealth: Payer: Self-pay

## 2023-08-09 NOTE — Telephone Encounter (Signed)
-----   Message from [place] sent at 08/04/2023  3:27 PM EDT ----- Diagnosis: Skin, chest :       SQUAMOUS CELL CARCINOMA, KERATOACANTHOMA TYPE    Please call to share diagnosis and discuss treatment options.  Explanation: This is a squamous cell skin cancer that has grown beyond the surface of the skin and is invading the second layer of the skin. It has the potential to spread beyond the skin and threaten your health, so I recommend treating it.  Treatment option 1: you return for a 15 minute appointment where we perform electrodesiccation and curettage [facility]). This involves three rounds of scraping and burning to destroy the skin cancer. It has about an 75-85% cure rate and leaves a round wound slightly larger than the skin cancer and leaves a round scar. No additional pathology is done. If the skin cancer comes back, we would need to do a surgery to remove it.   Treatment option 2: you return for an hour long appointment where we perform a skin surgery. We numb the site of the skin cancer and a safety margin of normal skin around it. We remove the full thickness of skin and close the wound with two layers of stitches. The sample is sent to the lab to check that the skin cancer was fully removed. Return one week later to have wound checked and surface stitches removed. Surgical wound leaves a line scar. Approximately 95% cure rate. Risk of recurrence, bleeding, infection, pain, injury to nearby structures, hypertrophic scar.

## 2023-08-09 NOTE — Telephone Encounter (Signed)
 Called patient. Discussed pathology results and treatment options. Patient voiced understanding. She prefers excision. Appointment scheduled for 08/16/2023 at 11:30 am. Pre-Operative Instructions sent through MyChart portal.

## 2023-08-16 ENCOUNTER — Encounter: Payer: Self-pay | Admitting: Dermatology

## 2023-08-16 ENCOUNTER — Telehealth: Payer: Self-pay

## 2023-08-16 ENCOUNTER — Ambulatory Visit: Admitting: Dermatology

## 2023-08-16 DIAGNOSIS — C4492 Squamous cell carcinoma of skin, unspecified: Secondary | ICD-10-CM

## 2023-08-16 DIAGNOSIS — C44529 Squamous cell carcinoma of skin of other part of trunk: Secondary | ICD-10-CM

## 2023-08-16 MED ORDER — MUPIROCIN 2 % EX OINT: TOPICAL_OINTMENT | CUTANEOUS | 1 refills | Status: DC

## 2023-08-16 NOTE — Patient Instructions (Addendum)

## 2023-08-16 NOTE — Telephone Encounter (Signed)
Patient doing fine after today's surgery./sh 

## 2023-08-16 NOTE — Progress Notes (Signed)
   Follow-Up Visit   Subjective  Bianca Brennan is a 43 y.o. female who presents for the following: Excision of chest  of a biopsy proven      SQUAMOUS CELL CARCINOMA, KERATOACANTHOMA TYPE  The following portions of the chart were reviewed this encounter and updated as appropriate: medications, allergies, medical history  Review of Systems:  No other skin or systemic complaints except as noted in HPI or Assessment and Plan.  Objective  Well appearing patient in no apparent distress; mood and affect are within normal limits.  A focused examination was performed of the following areas: Chest  Relevant physical exam findings are noted in the Assessment and Plan.   chest Healing biopsy site 1.1cm  Assessment & Plan   SQUAMOUS CELL CARCINOMA OF SKIN chest Skin excision  Excision method:  elliptical Lesion length (cm):  0.5 Margin per side (cm):  0.3 Total excision diameter (cm):  1.1 Informed consent: discussed and consent obtained   Timeout: patient name, date of birth, surgical site, and procedure verified   Procedure prep:  Patient was prepped and draped in usual sterile fashion Prep type:  Chlorhexidine Anesthesia: the lesion was anesthetized in a standard fashion   Anesthetic:  1% lidocaine  w/ epinephrine 1-100,000 buffered w/ 8.4% NaHCO3 (12cc) Instrument used: #15 blade   Hemostasis achieved with: suture, pressure and electrodesiccation   Outcome: patient tolerated procedure well with no complications    Skin repair Complexity:  Intermediate Final length (cm):  3 Informed consent: discussed and consent obtained   Timeout: patient name, date of birth, surgical site, and procedure verified   Procedure prep:  Patient was prepped and draped in usual sterile fashion Prep type:  Chlorhexidine Anesthesia: the lesion was anesthetized in a standard fashion   Anesthetic:  1% lidocaine  w/ epinephrine 1-100,000 buffered w/ 8.4% NaHCO3 Reason for type of repair: reduce tension to  allow closure, reduce the risk of dehiscence, infection, and necrosis, reduce subcutaneous dead space and avoid a hematoma, allow closure of the large defect and preserve normal anatomy   Undermining: edges could be approximated without difficulty   Subcutaneous layers (deep stitches):  Suture size:  4-0 Suture type: Monocryl (poliglecaprone 25)   Stitches:  Buried vertical mattress Fine/surface layer approximation (top stitches):  Suture size:  5-0 Suture type: Prolene (polypropylene)   Stitches: simple running   Hemostasis achieved with: suture, pressure and electrodesiccation Outcome: patient tolerated procedure well with no complications   Post-procedure details: sterile dressing applied and wound care instructions given   Dressing type: bandage, pressure dressing and bacitracin   Specimen 1 - Surgical pathology Differential Diagnosis:  Bx proven SQUAMOUS CELL CARCINOMA, KERATOACANTHOMA TYPE   Check Margins: yes  Accession: [id number] Superior tag  Bx proven SCC KA type Start Mupirocin ointment apply qd-bid    Return in about 1 week (around 08/23/2023) for suture removal .  I, Sonya Hupman, RMA, am acting as scribe for Harris Liming, MD .   Documentation: I have reviewed the above documentation for accuracy and completeness, and I agree with the above.  Harris Liming, MD

## 2023-08-22 LAB — SURGICAL PATHOLOGY

## 2023-08-23 ENCOUNTER — Encounter: Payer: Self-pay | Admitting: Dermatology

## 2023-08-23 ENCOUNTER — Ambulatory Visit: Payer: Self-pay | Admitting: Dermatology

## 2023-08-23 ENCOUNTER — Ambulatory Visit (INDEPENDENT_AMBULATORY_CARE_PROVIDER_SITE_OTHER): Admitting: Dermatology

## 2023-08-23 DIAGNOSIS — Z4802 Encounter for removal of sutures: Secondary | ICD-10-CM

## 2023-08-23 DIAGNOSIS — Z85828 Personal history of other malignant neoplasm of skin: Secondary | ICD-10-CM

## 2023-08-23 DIAGNOSIS — Z48817 Encounter for surgical aftercare following surgery on the skin and subcutaneous tissue: Secondary | ICD-10-CM

## 2023-08-23 DIAGNOSIS — Z5189 Encounter for other specified aftercare: Secondary | ICD-10-CM

## 2023-08-23 NOTE — Patient Instructions (Addendum)

## 2023-08-23 NOTE — Progress Notes (Signed)
   Follow-Up Visit   Subjective  Bianca Brennan is a 43 y.o. female who presents for the following: Suture removal  Pathology showed margins free  The following portions of the chart were reviewed this encounter and updated as appropriate: medications, allergies, medical history  Review of Systems:  No other skin or systemic complaints except as noted in HPI or Assessment and Plan.  Objective  Well appearing patient in no apparent distress; mood and affect are within normal limits.  Areas Examined: chest Relevant physical exam findings are noted in the Assessment and Plan.    Assessment & Plan   VISIT FOR WOUND CHECK   ENCOUNTER FOR REMOVAL OF SUTURES   Encounter for Removal of Sutures - Incision site is clean, dry and intact. - Wound cleansed, sutures removed, wound cleansed and steri strips applied.  - Discussed pathology results showing margins free - Patient advised to keep steri-strips dry until they fall off. - Scars remodel for a full year. - Once steri-strips fall off, patient can apply over-the-counter silicone scar cream once to twice a day to help with scar remodeling if desired. - Patient advised to call with any concerns or if they notice any new or changing lesions.  Patient advised to wait another week before going back to hot yoga.  Return for TBSE, with Dr. Linnell Richardson, as scheduled, HxSCC.  Kerstin Peeling, RMA, am acting as scribe for Harris Liming, MD .   Documentation: I have reviewed the above documentation for accuracy and completeness, and I agree with the above.  Harris Liming, MD

## 2023-09-04 ENCOUNTER — Ambulatory Visit: Admitting: Dermatology

## 2023-12-25 ENCOUNTER — Ambulatory Visit: Admitting: Dermatology

## 2023-12-26 ENCOUNTER — Encounter

## 2023-12-28 ENCOUNTER — Ambulatory Visit: Admitting: Dermatology

## 2024-01-24 ENCOUNTER — Ambulatory Visit: Payer: Self-pay | Admitting: Dermatology

## 2024-02-16 ENCOUNTER — Other Ambulatory Visit: Payer: Self-pay | Admitting: Dermatology

## 2024-02-23 ENCOUNTER — Other Ambulatory Visit: Payer: Self-pay | Admitting: Internal Medicine

## 2024-02-23 DIAGNOSIS — Z1231 Encounter for screening mammogram for malignant neoplasm of breast: Secondary | ICD-10-CM

## 2024-03-14 ENCOUNTER — Ambulatory Visit: Admitting: Podiatry

## 2024-03-14 DIAGNOSIS — B351 Tinea unguium: Secondary | ICD-10-CM | POA: Diagnosis not present

## 2024-03-14 NOTE — Progress Notes (Signed)
°  Subjective:  Patient ID: Bianca Brennan, female    DOB: 1980/05/12,  MRN: [id number]  Chief Complaint  Patient presents with   Nail Problem    Pt stated that her nails have some discoloration     43 y.o. female presents with the above complaint.  Patient presents with bilateral hallux and left second digit nail onychomycosis with thickened dystrophic mycotic nail.  She wanted get it evaluated has not seen anyone else prior to seeing me for this.  When I discussed laser treatment options.  Pain scale 0 out of 10.  Denies any other acute complaints   Review of Systems: Negative except as noted in the HPI. Denies N/V/F/Ch.  Past Medical History:  Diagnosis Date   Anxiety    related to CLBP   Depression    related to CLBP   Hashimoto's disease    Squamous cell carcinoma of skin 07/25/2023   Mid chest. KA-type. Excised 08/16/2023   Current Medications[1]  Tobacco Use History[2]  Allergies[3] Objective:  There were no vitals filed for this visit. There is no height or weight on file to calculate BMI. Constitutional Well developed. Well nourished.  Vascular Dorsalis pedis pulses palpable bilaterally. Posterior tibial pulses palpable bilaterally. Capillary refill normal to all digits.  No cyanosis or clubbing noted. Pedal hair growth normal.  Neurologic Normal speech. Oriented to person, place, and time. Epicritic sensation to light touch grossly present bilaterally.  Dermatologic Nails thickened onychodystrophy mycotic toenails to bilateral hallux left second digit.  No pain on palpation Skin within normal limits  Orthopedic: Normal joint ROM without pain or crepitus bilaterally. No visible deformities. No bony tenderness.   Radiographs: None Assessment:   1. Onychomycosis due to dermatophyte   2. Nail fungus    Plan:  Patient was evaluated and treated and all questions answered.  Bilateral hallux and left second digit onychomycosis -Educated the patient on the  etiology of onychomycosis and various treatment options associated with improving the fungal load.  I explained to the patient that there is 3 treatment options available to treat the onychomycosis including topical, p.o., laser treatment.  Patient elected to undergo laser therapy.  I discussed this with the patient and she states understanding would take 6-7 sessions she states understanding.   No follow-ups on file.    [1]  Current Outpatient Medications:    cholecalciferol (VITAMIN D) 1000 UNITS tablet, Take 1,000 Units by mouth daily., Disp: , Rfl:    cyclobenzaprine  (FLEXERIL ) 5 MG tablet, Take 5 mg by mouth 3 (three) times daily as needed for muscle spasms., Disp: , Rfl:    ferrous sulfate  325 (65 FE) MG tablet, Take 1 tablet (325 mg total) by mouth daily with breakfast. Take with Vitamin C , Disp: 30 tablet, Rfl: 1   levothyroxine  (SYNTHROID , LEVOTHROID) 100 MCG tablet, Take 100 mcg by mouth daily before breakfast., Disp: , Rfl:    mupirocin  ointment (BACTROBAN ) 2 %, APPLY TO EXCISION SITE 1-2 TIMES A DAY, Disp: 22 g, Rfl: 1   sertraline  (ZOLOFT ) 100 MG tablet, Take 100 mg by mouth daily., Disp: , Rfl:  [2]  Social History Tobacco Use  Smoking Status Never  Smokeless Tobacco Never  [3]  Allergies Allergen Reactions   Penicillins Hives    Pt states new onset of hives after starting medication 2-3 weeks ago.  ALL CILLINS   Amoxicillin    Dicloxacillin Hives

## 2024-04-10 ENCOUNTER — Ambulatory Visit
Admission: RE | Admit: 2024-04-10 | Discharge: 2024-04-10 | Disposition: A | Discharge status: Home / Self Care | Source: Ambulatory Visit | Attending: Internal Medicine | Admitting: Internal Medicine

## 2024-04-10 DIAGNOSIS — Z1231 Encounter for screening mammogram for malignant neoplasm of breast: Secondary | ICD-10-CM | POA: Insufficient documentation

## 2024-05-07 ENCOUNTER — Ambulatory Visit

## 2024-05-21 ENCOUNTER — Ambulatory Visit
